# Patient Record
Sex: Male | Born: 1965
Health system: Southern US, Community
[De-identification: ages and names within clinical notes are randomized; demographics above are authoritative.]

## PROBLEM LIST (undated history)

## (undated) DIAGNOSIS — K219 Gastro-esophageal reflux disease without esophagitis: Secondary | ICD-10-CM

## (undated) DIAGNOSIS — R319 Hematuria, unspecified: Secondary | ICD-10-CM

## (undated) DIAGNOSIS — L409 Psoriasis, unspecified: Secondary | ICD-10-CM

## (undated) DIAGNOSIS — E785 Hyperlipidemia, unspecified: Secondary | ICD-10-CM

## (undated) HISTORY — DX: Hematuria, unspecified: R31.9

## (undated) HISTORY — PX: WISDOM TOOTH EXTRACTION: SHX21

## (undated) HISTORY — PX: NO PAST SURGERIES: SHX2092

## (undated) HISTORY — DX: Hyperlipidemia, unspecified: E78.5

## (undated) HISTORY — DX: Psoriasis, unspecified: L40.9

---

## 2014-08-08 DIAGNOSIS — R319 Hematuria, unspecified: Secondary | ICD-10-CM

## 2014-08-08 HISTORY — DX: Hematuria, unspecified: R31.9

## 2015-04-22 ENCOUNTER — Encounter: Payer: Self-pay | Admitting: *Deleted

## 2015-04-24 ENCOUNTER — Ambulatory Visit (INDEPENDENT_AMBULATORY_CARE_PROVIDER_SITE_OTHER): Payer: BLUE CROSS/BLUE SHIELD | Admitting: Obstetrics and Gynecology

## 2015-04-24 ENCOUNTER — Encounter: Payer: Self-pay | Admitting: Obstetrics and Gynecology

## 2015-04-24 VITALS — BP 137/91 | HR 75 | Resp 16 | Ht 74.0 in | Wt 205.3 lb

## 2015-04-24 DIAGNOSIS — R310 Gross hematuria: Secondary | ICD-10-CM

## 2015-04-24 DIAGNOSIS — R972 Elevated prostate specific antigen [PSA]: Secondary | ICD-10-CM

## 2015-04-24 LAB — MICROSCOPIC EXAMINATION
Bacteria, UA: NONE SEEN
EPITHELIAL CELLS (NON RENAL): NONE SEEN /HPF (ref 0–10)
RBC, UA: NONE SEEN /hpf (ref 0–?)

## 2015-04-24 LAB — URINALYSIS, COMPLETE
BILIRUBIN UA: NEGATIVE
Glucose, UA: NEGATIVE
Ketones, UA: NEGATIVE
Leukocytes, UA: NEGATIVE
Nitrite, UA: NEGATIVE
PH UA: 6 (ref 5.0–7.5)
Protein, UA: NEGATIVE
RBC UA: NEGATIVE
Specific Gravity, UA: 1.02 (ref 1.005–1.030)
UUROB: 0.2 mg/dL (ref 0.2–1.0)

## 2015-04-24 NOTE — Progress Notes (Signed)
04/24/2015 12:05 PM   Trevor Walker 1966-04-26 [id number]  Referring provider: No referring provider defined for this encounter.  Chief Complaint  Patient presents with  . Hematuria    HPI: Patient is a 49 year old male presenting today with a history of rising PSA presenting today with complaints of gross hematuria.  1. Gross hematuria- patient reports intermittent bright red gross hematuria over the last 6-7 weeks. He has noticed very mild dysuria but otherwise complains of no other urinary symptoms. No history of renal stones. Patient was never a smoker.  2.History of rising PSA-Per patient provided records he has a history of a rising PSA but it seemed that it was transient and PSA checked last month was 2.0. He denies any urinary symptoms other than above gross hematuria. He believes that his grandfather may have had prostate cancer. 06/15/12 PSA 1.12 04/23/14 PSA 2.70 04/01/15 PSA 2.0    PMH: Past Medical History  Diagnosis Date  . HLD (hyperlipidemia)   . Hematuria     Surgical History: History reviewed. No pertinent past surgical history.  Home Medications:    Medication List       This list is accurate as of: 04/24/15 12:05 PM.  Always use your most recent med list.               omeprazole 20 MG capsule  Commonly known as:  PRILOSEC  Take 20 mg by mouth daily.        Allergies:  Allergies  Allergen Reactions  . Sulfa Antibiotics Rash    Family History: Family History  Problem Relation Age of Onset  . Heart failure Father   . Diabetes Father   . Stroke Paternal Uncle   . Diabetes Paternal Uncle     Social History:  reports that he has never smoked. He does not have any smokeless tobacco history on file. His alcohol and drug histories are not on file.  ROS: UROLOGY Frequent Urination?: No Hard to postpone urination?: No Burning/pain with urination?: Yes Get up at night to urinate?: Yes Leakage of urine?: No Urine stream starts and stops?:  No Trouble starting stream?: No Do you have to strain to urinate?: No Blood in urine?: Yes Urinary tract infection?: No Sexually transmitted disease?: No Injury to kidneys or bladder?: No Painful intercourse?: No Weak stream?: No Erection problems?: No Penile pain?: No  Gastrointestinal Nausea?: No Vomiting?: No Indigestion/heartburn?: Yes Diarrhea?: No Constipation?: No  Constitutional Fever: No Night sweats?: No Weight loss?: No Fatigue?: No  Skin Skin rash/lesions?: No Itching?: No  Eyes Blurred vision?: No Double vision?: No  Ears/Nose/Throat Sore throat?: No Sinus problems?: No  Hematologic/Lymphatic Swollen glands?: No Easy bruising?: No  Cardiovascular Leg swelling?: No Chest pain?: No  Respiratory Cough?: No Shortness of breath?: No  Endocrine Excessive thirst?: No  Musculoskeletal Back pain?: No Joint pain?: No  Neurological Headaches?: No Dizziness?: No  Psychologic Depression?: No Anxiety?: No  Physical Exam: BP 137/91 mmHg  Pulse 75  Resp 16  Ht 6\' 2"  (1.88 m)  Wt 205 lb 4.8 oz (93.123 kg)  BMI 26.35 kg/m2  Constitutional:  Alert and oriented, No acute distress. HEENT: [place] AT, moist mucus membranes.  Trachea midline, no masses. Cardiovascular: No clubbing, cyanosis, or edema. Respiratory: Normal respiratory effort, no increased work of breathing. GI: Abdomen is soft, nontender, nondistended, no abdominal masses GU: No CVA tenderness, circumcised normal phallus, testicles distended bilaterally without palpable masses or tenderness  DRE: Prostate normal in size and smooth Skin: No  rashes, bruises or suspicious lesions. Lymph: No cervical or inguinal adenopathy. Neurologic: Grossly intact, no focal deficits, moving all 4 extremities. Psychiatric: Normal mood and affect.  Laboratory Data:   Urinalysis No results found for: COLORURINE, APPEARANCEUR, LABSPEC, PHURINE, GLUCOSEU, HGBUR, BILIRUBINUR, KETONESUR, PROTEINUR,  UROBILINOGEN, NITRITE, LEUKOCYTESUR  Pertinent Imaging:  Assessment & Plan:   1. Gross hematuria- UA unremarkable today. We discussed the differential diagnosis for microscopic hematuria including nephrolithiasis, renal or upper tract tumors, bladder stones, UTIs, or bladder tumors as well as undetermined etiologies. Per AUA guidelines, I did recommend complete microscopic hematuria evaluation including CTU, possible urine cytology, and office cystoscopy.  Patient is in agreement with plan. - Urinalysis, Complete  2. History of Rising PSA- Per patient provided records he has a history of a rising PSA but it seemed that it was transient and PSA checked last month was 2.0. He denies any urinary symptoms other than above gross hematuria. Prostate exam unremarkable today. He believes that his grandfather may have had prostate cancer. Recommend yearly monitoring here or with patient's PCP with referral for any further elevations of PSA. 06/15/12 PSA 1.12 04/23/14 PSA 2.70 04/01/15 PSA 2.0  Return for schedule cystoscopy; f/u appt CT urogram results.  These notes generated with voice recognition software. I apologize for typographical errors.  Herbert Moors, [facility] [address] [place], [place] [address] [phone]

## 2015-05-12 ENCOUNTER — Ambulatory Visit
Admission: RE | Admit: 2015-05-12 | Discharge: 2015-05-12 | Disposition: A | Discharge status: Home / Self Care | Payer: BLUE CROSS/BLUE SHIELD | Source: Ambulatory Visit | Attending: Obstetrics and Gynecology | Admitting: Obstetrics and Gynecology

## 2015-05-12 DIAGNOSIS — R310 Gross hematuria: Secondary | ICD-10-CM | POA: Insufficient documentation

## 2015-05-12 MED ORDER — IOHEXOL 350 MG/ML SOLN
125.0000 mL | Freq: Once | INTRAVENOUS | Status: AC | PRN
  Administered 2015-05-12: 125 mL via INTRAVENOUS

## 2015-05-20 ENCOUNTER — Other Ambulatory Visit: Payer: BLUE CROSS/BLUE SHIELD

## 2015-05-20 ENCOUNTER — Encounter: Payer: Self-pay | Admitting: Urology

## 2015-05-20 ENCOUNTER — Ambulatory Visit (INDEPENDENT_AMBULATORY_CARE_PROVIDER_SITE_OTHER): Payer: BLUE CROSS/BLUE SHIELD | Admitting: Urology

## 2015-05-20 VITALS — BP 122/84 | HR 71 | Ht 74.0 in | Wt 209.1 lb

## 2015-05-20 DIAGNOSIS — R310 Gross hematuria: Secondary | ICD-10-CM | POA: Diagnosis not present

## 2015-05-20 LAB — URINALYSIS, COMPLETE
BILIRUBIN UA: NEGATIVE
GLUCOSE, UA: NEGATIVE
KETONES UA: NEGATIVE
Leukocytes, UA: NEGATIVE
NITRITE UA: NEGATIVE
Protein, UA: NEGATIVE
RBC UA: NEGATIVE
SPEC GRAV UA: 1.015 (ref 1.005–1.030)
Urobilinogen, Ur: 0.2 mg/dL (ref 0.2–1.0)
pH, UA: 6.5 (ref 5.0–7.5)

## 2015-05-20 LAB — MICROSCOPIC EXAMINATION
BACTERIA UA: NONE SEEN
Epithelial Cells (non renal): NONE SEEN /hpf (ref 0–10)
RBC, UA: NONE SEEN /hpf (ref 0–?)
Renal Epithel, UA: NONE SEEN /hpf
WBC UA: NONE SEEN /HPF (ref 0–?)

## 2015-05-20 MED ORDER — LIDOCAINE HCL 2 % EX GEL
1.0000 "application " | Freq: Once | CUTANEOUS | Status: AC
  Administered 2015-05-20: 1 via URETHRAL

## 2015-05-20 MED ORDER — CIPROFLOXACIN HCL 500 MG PO TABS
500.0000 mg | ORAL_TABLET | Freq: Once | ORAL | Status: AC
  Administered 2015-05-20: 500 mg via ORAL

## 2015-05-20 NOTE — Progress Notes (Signed)
05/20/2015 4:03 PM   Trevor Walker 04-18-1966 [id number]  Referring provider: No referring provider defined for this encounter.  Chief Complaint  Patient presents with  . Hematuria    HPI: HPI: Patient is a 49 year old male presenting today with a history of rising PSA presenting today with complaints of gross hematuria.  1. Gross hematuria- patient reports intermittent bright red gross hematuria over the last 6-7 weeks. He has noticed very mild dysuria but otherwise complains of no other urinary symptoms. No history of renal stones. Patient was never a smoker.  2.History of rising PSA-Per patient provided records he has a history of a rising PSA but it seemed that it was transient and PSA checked last month was 2.0. He denies any urinary symptoms other than above gross hematuria. He believes that his grandfather may have had prostate cancer. 06/15/12 PSA 1.12 04/23/14 PSA 2.70 04/01/15 PSA 2.0     PMH: Past Medical History  Diagnosis Date  . HLD (hyperlipidemia)   . Hematuria     Surgical History: Past Surgical History  Procedure Laterality Date  . No past surgeries      Home Medications:    Medication List       This list is accurate as of: 05/20/15  4:03 PM.  Always use your most recent med list.               ketoconazole 2 % cream  Commonly known as:  NIZORAL  Apply 1 application topically daily.     omeprazole 20 MG capsule  Commonly known as:  PRILOSEC  Take 20 mg by mouth daily.        Allergies:  Allergies  Allergen Reactions  . Sulfa Antibiotics Rash    Family History: Family History  Problem Relation Age of Onset  . Heart failure Father   . Diabetes Father   . Stroke Paternal Uncle   . Diabetes Paternal Uncle     Social History:  reports that he has never smoked. He does not have any smokeless tobacco history on file. He reports that he drinks alcohol. He reports that he does not use illicit drugs.  ROS:                                         Physical Exam: BP 122/84 mmHg  Pulse 71  Ht 6\' 2"  (1.88 m)  Wt 94.847 kg (209 lb 1.6 oz)  BMI 26.84 kg/m2  Constitutional:  Alert and oriented, No acute distress. HEENT: [place] AT, moist mucus membranes.  Trachea midline, no masses. Cardiovascular: No clubbing, cyanosis, or edema. Respiratory: Normal respiratory effort, no increased work of breathing. GI: Abdomen is soft, nontender, nondistended, no abdominal masses GU: No CVA tenderness. Male genitalia normal Skin: No rashes, bruises or suspicious lesions. Lymph: No cervical or inguinal adenopathy. Neurologic: Grossly intact, no focal deficits, moving all 4 extremities. Psychiatric: Normal mood and affect.  Laboratory Data:  Urinalysis    Component Value Date/Time   GLUCOSEU Negative 04/24/2015 1104   BILIRUBINUR Negative 04/24/2015 1104   NITRITE Negative 04/24/2015 1104   LEUKOCYTESUR Negative 04/24/2015 1104    Pertinent Imaging CT scan normal  Cystoscopy: After verbal consent patient underwent forceful cystoscopy utilizing sterile technique. The penile bulbar membranous and prostatic urethra were normal. He a little bit of a high riding bladder neck. There is no trabeculation or cystitis. There is no foreign  body or carcinoma. There is no erythema. He tolerated the procedure very well   Assessment & Plan:  The patient is here today to complete his evaluation for hematuria. He is here for cystoscopy. He's had no hematuria since his last episode. He's had a negative workup will be seen when necessary  1. Gross hematuria See prn   Return if symptoms worsen or fail to improve.  Reece Packer, MD  [facility] [address], [place], [place] [address] [phone]

## 2016-08-30 DIAGNOSIS — Z1389 Encounter for screening for other disorder: Secondary | ICD-10-CM | POA: Diagnosis not present

## 2016-08-30 DIAGNOSIS — Z0000 Encounter for general adult medical examination without abnormal findings: Secondary | ICD-10-CM | POA: Diagnosis not present

## 2016-08-30 DIAGNOSIS — Z23 Encounter for immunization: Secondary | ICD-10-CM | POA: Diagnosis not present

## 2016-09-29 ENCOUNTER — Other Ambulatory Visit: Payer: Self-pay | Admitting: Gastroenterology

## 2016-11-28 ENCOUNTER — Encounter (HOSPITAL_COMMUNITY): Payer: Self-pay | Admitting: *Deleted

## 2016-11-29 ENCOUNTER — Ambulatory Visit (HOSPITAL_COMMUNITY): Payer: BLUE CROSS/BLUE SHIELD | Admitting: Anesthesiology

## 2016-11-29 ENCOUNTER — Ambulatory Visit (HOSPITAL_COMMUNITY)
Admission: RE | Admit: 2016-11-29 | Discharge: 2016-11-29 | Disposition: A | Discharge status: Home / Self Care | Payer: BLUE CROSS/BLUE SHIELD | Source: Ambulatory Visit | Attending: Gastroenterology | Admitting: Gastroenterology

## 2016-11-29 ENCOUNTER — Encounter (HOSPITAL_COMMUNITY)
Admission: RE | Disposition: A | Discharge status: Home / Self Care | Payer: Self-pay | Source: Ambulatory Visit | Attending: Gastroenterology

## 2016-11-29 ENCOUNTER — Encounter (HOSPITAL_COMMUNITY): Payer: Self-pay | Admitting: *Deleted

## 2016-11-29 DIAGNOSIS — Z1211 Encounter for screening for malignant neoplasm of colon: Secondary | ICD-10-CM | POA: Insufficient documentation

## 2016-11-29 DIAGNOSIS — K621 Rectal polyp: Secondary | ICD-10-CM | POA: Insufficient documentation

## 2016-11-29 DIAGNOSIS — Z882 Allergy status to sulfonamides status: Secondary | ICD-10-CM | POA: Diagnosis not present

## 2016-11-29 DIAGNOSIS — K219 Gastro-esophageal reflux disease without esophagitis: Secondary | ICD-10-CM | POA: Diagnosis not present

## 2016-11-29 DIAGNOSIS — Z886 Allergy status to analgesic agent status: Secondary | ICD-10-CM | POA: Insufficient documentation

## 2016-11-29 DIAGNOSIS — D120 Benign neoplasm of cecum: Secondary | ICD-10-CM | POA: Diagnosis not present

## 2016-11-29 DIAGNOSIS — D128 Benign neoplasm of rectum: Secondary | ICD-10-CM | POA: Diagnosis not present

## 2016-11-29 HISTORY — PX: COLONOSCOPY WITH PROPOFOL: SHX5780

## 2016-11-29 HISTORY — DX: Gastro-esophageal reflux disease without esophagitis: K21.9

## 2016-11-29 SURGERY — COLONOSCOPY WITH PROPOFOL
Anesthesia: Monitor Anesthesia Care

## 2016-11-29 MED ORDER — LIDOCAINE 2% (20 MG/ML) 5 ML SYRINGE
INTRAMUSCULAR | Status: DC | PRN
  Administered 2016-11-29: 60 mg via INTRAVENOUS

## 2016-11-29 MED ORDER — PROPOFOL 10 MG/ML IV BOLUS
INTRAVENOUS | Status: DC | PRN
  Administered 2016-11-29: 40 mg via INTRAVENOUS
  Administered 2016-11-29: 20 mg via INTRAVENOUS

## 2016-11-29 MED ORDER — PROPOFOL 500 MG/50ML IV EMUL
INTRAVENOUS | Status: DC | PRN
  Administered 2016-11-29: 150 ug/kg/min via INTRAVENOUS

## 2016-11-29 MED ORDER — LIDOCAINE 2% (20 MG/ML) 5 ML SYRINGE
INTRAMUSCULAR | Status: AC
  Filled 2016-11-29: qty 5

## 2016-11-29 MED ORDER — SODIUM CHLORIDE 0.9 % IV SOLN: INTRAVENOUS | Status: DC

## 2016-11-29 MED ORDER — PROPOFOL 10 MG/ML IV BOLUS
INTRAVENOUS | Status: AC
  Filled 2016-11-29: qty 60

## 2016-11-29 MED ORDER — LACTATED RINGERS IV SOLN
INTRAVENOUS | Status: DC
  Administered 2016-11-29: 1000 mL via INTRAVENOUS

## 2016-11-29 SURGICAL SUPPLY — 21 items

## 2016-11-29 NOTE — Anesthesia Procedure Notes (Signed)
Procedure Name: MAC Date/Time: 11/29/2016 7:51 AM Performed by: Dione Booze Pre-anesthesia Checklist: Patient identified, Emergency Drugs available, Suction available and Patient being monitored Patient Re-evaluated:Patient Re-evaluated prior to inductionOxygen Delivery Method: Simple face mask Placement Confirmation: positive ETCO2

## 2016-11-29 NOTE — Discharge Instructions (Signed)

## 2016-11-29 NOTE — Anesthesia Preprocedure Evaluation (Signed)
Anesthesia Evaluation  Patient identified by MRN, date of birth, ID band Patient awake    Reviewed: Allergy & Precautions, NPO status , Patient's Chart, lab work & pertinent test results  Airway Mallampati: II  TM Distance: >3 FB Neck ROM: Full    Dental no notable dental hx.    Pulmonary neg pulmonary ROS,    Pulmonary exam normal breath sounds clear to auscultation       Cardiovascular negative cardio ROS Normal cardiovascular exam Rhythm:Regular Rate:Normal     Neuro/Psych negative neurological ROS  negative psych ROS   GI/Hepatic Neg liver ROS, GERD  Medicated,  Endo/Other  negative endocrine ROS  Renal/GU negative Renal ROS  negative genitourinary   Musculoskeletal negative musculoskeletal ROS (+)   Abdominal   Peds negative pediatric ROS (+)  Hematology negative hematology ROS (+)   Anesthesia Other Findings   Reproductive/Obstetrics negative OB ROS                             Anesthesia Physical Anesthesia Plan  ASA: II  Anesthesia Plan: MAC   Post-op Pain Management:    Induction: Intravenous  Airway Management Planned: Nasal Cannula  Additional Equipment:   Intra-op Plan:   Post-operative Plan:   Informed Consent: I have reviewed the patients History and Physical, chart, labs and discussed the procedure including the risks, benefits and alternatives for the proposed anesthesia with the patient or authorized representative who has indicated his/her understanding and acceptance.   Dental advisory given  Plan Discussed with: CRNA and Surgeon  Anesthesia Plan Comments:         Anesthesia Quick Evaluation

## 2016-11-29 NOTE — Op Note (Signed)
[facility] Patient Name: Trevor Walker Procedure Date: 11/29/2016 MRN: [id number] Attending MD: Garlan Fair , MD Date of Birth: 1966-02-19 CSN: [id number] Age: 51 Admit Type: Outpatient Procedure:                Colonoscopy Indications:              Screening for colorectal malignant neoplasm Providers:                Garlan Fair, MD, Laverta Baltimore RN, RN,                            Cherylynn Ridges, Technician Referring MD:              Medicines:                Propofol per Anesthesia Complications:            No immediate complications. Estimated Blood Loss:     Estimated blood loss was minimal. Procedure:                Pre-Anesthesia Assessment:                           - Prior to the procedure, a History and Physical                            was performed, and patient medications and                            allergies were reviewed. The patient's tolerance of                            previous anesthesia was also reviewed. The risks                            and benefits of the procedure and the sedation                            options and risks were discussed with the patient.                            All questions were answered, and informed consent                            was obtained. Prior Anticoagulants: The patient has                            taken no previous anticoagulant or antiplatelet                            agents. ASA Grade Assessment: II - A patient with                            mild systemic disease. After reviewing the risks  and benefits, the patient was deemed in                            satisfactory condition to undergo the procedure.                           After obtaining informed consent, the colonoscope                            was passed under direct vision. Throughout the                            procedure, the patient's blood pressure, pulse, and    oxygen saturations were monitored continuously. The                            EC-[id number] ([id number]) scope was introduced through                            the anus and advanced to the the cecum, identified                            by appendiceal orifice and ileocecal valve. The                            colonoscopy was performed without difficulty. The                            patient tolerated the procedure well. The quality                            of the bowel preparation was good. The appendiceal                            orifice and the rectum were photographed. Scope In: 0:62:37 AM Scope Out: 8:27:31 AM Total Procedure Duration: 0 hours 30 minutes 47 seconds  Findings:      The perianal and digital rectal examinations were normal.      Two sessile polyps were found in the cecum. The polyps were 3 to 4 mm in       size. These polyps were removed with a cold biopsy forceps. Resection       and retrieval were complete.      A 5 mm polyp was found in the rectum. The polyp was sessile. The polyp       was removed with a cold snare. Resection and retrieval were complete.      The exam was otherwise without abnormality. Impression:               - Two 3 to 4 mm polyps in the cecum, removed with a                            cold biopsy forceps. Resected and retrieved.                           -  One 5 mm polyp in the rectum, removed with a cold                            snare. Resected and retrieved.                           - The examination was otherwise normal. Moderate Sedation:      N/A- Per Anesthesia Care Recommendation:           - Patient has a contact number available for                            emergencies. The signs and symptoms of potential                            delayed complications were discussed with the                            patient. Return to normal activities tomorrow.                            Written discharge instructions were provided to the                             patient.                           - Repeat colonoscopy date to be determined after                            pending pathology results are reviewed for                            surveillance.                           - Resume previous diet.                           - Continue present medications. Procedure Code(s):        --- Professional ---                           [phone], Colonoscopy, flexible; with removal of                            tumor(s), polyp(s), or other lesion(s) by snare                            technique                           45380, 59, Colonoscopy, flexible; with biopsy,                            single or multiple Diagnosis Code(s):        ---  Professional ---                           Z12.11, Encounter for screening for malignant                            neoplasm of colon                           D12.0, Benign neoplasm of cecum                           K62.1, Rectal polyp CPT copyright 2016 American Medical Association. All rights reserved. The codes documented in this report are preliminary and upon coder review may  be revised to meet current compliance requirements. Earle Gell, MD Garlan Fair, MD 11/29/2016 8:34:12 AM This report has been signed electronically. Number of Addenda: 0

## 2016-11-29 NOTE — H&P (Signed)
Procedure: Baseline screening colonoscopy  History: The patient is a 51 year old male born [birth year]. He is scheduled to undergo his first screening colonoscopy with polypectomy to prevent colon cancer.  Medication allergies: Sulfa drugs cause rash. Ibuprofen causes rash.  Past medical history: Gastroesophageal reflux  Family history: Negative for colon cancer  Exam: The patient is alert and lying comfortably on the endoscopy stretcher. Abdomen is soft and nontender to palpation. Lungs are clear to auscultation. Cardiac exam reveals a regular rhythm.  Plan: Proceed with baseline screening colonoscopy

## 2016-11-29 NOTE — Transfer of Care (Signed)
Immediate Anesthesia Transfer of Care Note  Patient: Trevor Walker  Procedure(s) Performed: Procedure(s):  colonscopy  w propofol (N/A)  Patient Location: PACU and Endoscopy Unit  Anesthesia Type:MAC  Level of Consciousness: sedated and patient cooperative  Airway & Oxygen Therapy: Patient Spontanous Breathing and Patient connected to face mask oxygen  Post-op Assessment: Report given to RN and Post -op Vital signs reviewed and stable  Post vital signs: Reviewed and stable  Last Vitals:  Vitals:   11/29/16 0712  BP: 128/81  Pulse: 68  Resp: 13  Temp: 36.7 C    Last Pain:  Vitals:   11/29/16 0712  TempSrc: Oral         Complications: No apparent anesthesia complications

## 2016-11-29 NOTE — Anesthesia Postprocedure Evaluation (Signed)
Anesthesia Post Note  Patient: Trevor Walker  Procedure(s) Performed: Procedure(s) (LRB):  colonscopy  w propofol (N/A)  Patient location during evaluation: PACU Anesthesia Type: MAC Level of consciousness: awake and alert Pain management: pain level controlled Vital Signs Assessment: post-procedure vital signs reviewed and stable Respiratory status: spontaneous breathing, nonlabored ventilation, respiratory function stable and patient connected to nasal cannula oxygen Cardiovascular status: stable and blood pressure returned to baseline Anesthetic complications: no       Last Vitals:  Vitals:   11/29/16 0850 11/29/16 0855  BP: 112/61 125/82  Pulse: (!) 59 69  Resp: 13 15  Temp:      Last Pain:  Vitals:   11/29/16 0833  TempSrc: Oral                 Aleicia Kenagy S

## 2016-12-01 ENCOUNTER — Encounter (HOSPITAL_COMMUNITY): Payer: Self-pay | Admitting: Gastroenterology

## 2017-01-09 NOTE — Addendum Note (Signed)
Addendum  created 01/09/17 1345 by Myrtie Soman, MD   Sign clinical note

## 2017-01-09 NOTE — Anesthesia Postprocedure Evaluation (Signed)
Anesthesia Post Note  Patient: Trevor Walker  Procedure(s) Performed: Procedure(s) (LRB):  colonscopy  w propofol (N/A)     Anesthesia Post Evaluation  Last Vitals:  Vitals:   11/29/16 0850 11/29/16 0855  BP: 112/61 125/82  Pulse: (!) 59 69  Resp: 13 15  Temp:      Last Pain:  Vitals:   11/30/16 1331  TempSrc:   PainSc: 0-No pain                 Gerlene Glassburn S

## 2017-09-27 DIAGNOSIS — Z125 Encounter for screening for malignant neoplasm of prostate: Secondary | ICD-10-CM | POA: Diagnosis not present

## 2017-09-27 DIAGNOSIS — Z0000 Encounter for general adult medical examination without abnormal findings: Secondary | ICD-10-CM | POA: Diagnosis not present

## 2017-09-27 DIAGNOSIS — Z136 Encounter for screening for cardiovascular disorders: Secondary | ICD-10-CM | POA: Diagnosis not present

## 2017-09-27 DIAGNOSIS — Z1389 Encounter for screening for other disorder: Secondary | ICD-10-CM | POA: Diagnosis not present

## 2018-10-03 DIAGNOSIS — Z0000 Encounter for general adult medical examination without abnormal findings: Secondary | ICD-10-CM | POA: Diagnosis not present

## 2018-10-03 DIAGNOSIS — Z1389 Encounter for screening for other disorder: Secondary | ICD-10-CM | POA: Diagnosis not present

## 2018-10-03 DIAGNOSIS — Z13220 Encounter for screening for lipoid disorders: Secondary | ICD-10-CM | POA: Diagnosis not present

## 2018-10-03 DIAGNOSIS — K219 Gastro-esophageal reflux disease without esophagitis: Secondary | ICD-10-CM | POA: Diagnosis not present

## 2018-10-03 DIAGNOSIS — Z125 Encounter for screening for malignant neoplasm of prostate: Secondary | ICD-10-CM | POA: Diagnosis not present

## 2018-12-28 ENCOUNTER — Ambulatory Visit: Payer: BLUE CROSS/BLUE SHIELD | Admitting: Podiatry

## 2019-01-04 ENCOUNTER — Ambulatory Visit (INDEPENDENT_AMBULATORY_CARE_PROVIDER_SITE_OTHER): Payer: BLUE CROSS/BLUE SHIELD

## 2019-01-04 ENCOUNTER — Ambulatory Visit: Payer: BLUE CROSS/BLUE SHIELD | Admitting: Podiatry

## 2019-01-04 ENCOUNTER — Other Ambulatory Visit: Payer: Self-pay

## 2019-01-04 ENCOUNTER — Other Ambulatory Visit: Payer: Self-pay | Admitting: Podiatry

## 2019-01-04 VITALS — Temp 97.5°F

## 2019-01-04 DIAGNOSIS — L309 Dermatitis, unspecified: Secondary | ICD-10-CM

## 2019-01-04 DIAGNOSIS — L409 Psoriasis, unspecified: Secondary | ICD-10-CM | POA: Diagnosis not present

## 2019-01-04 DIAGNOSIS — M109 Gout, unspecified: Secondary | ICD-10-CM | POA: Diagnosis not present

## 2019-01-04 DIAGNOSIS — M25571 Pain in right ankle and joints of right foot: Secondary | ICD-10-CM | POA: Diagnosis not present

## 2019-01-04 DIAGNOSIS — M7751 Other enthesopathy of right foot: Secondary | ICD-10-CM | POA: Diagnosis not present

## 2019-01-04 DIAGNOSIS — M79671 Pain in right foot: Secondary | ICD-10-CM

## 2019-01-04 DIAGNOSIS — R600 Localized edema: Secondary | ICD-10-CM

## 2019-01-05 LAB — SYNOVIAL FLUID, CRYSTAL

## 2019-01-06 NOTE — Progress Notes (Signed)
Subjective:  Patient ID: Trevor Walker, male    DOB: 16-Jul-1966,  MRN: [id number]  Chief Complaint  Patient presents with  . Foot Problem    Pt states right 1st MPJ Redness/pain/swelling 3-4 week duration, worse at night, pain comes and goes. Pt states history of possible gout.    53 y.o. male presents with the above complaint.  States that the pain started when he went on a trip to [place] unsure if he was eating differently with time has had this pain on and off since then.  Endorses possible history of gout but thinks it was 67.  Separately complains of a skin condition that seems to be over his entire body.  Recently diagnosed with psoriasis by his dermatologist.  Has been trying several different medications to get this under control.   Review of Systems: Negative except as noted in the HPI. Denies N/V/F/Ch.  Past Medical History:  Diagnosis Date  . GERD (gastroesophageal reflux disease)   . Hematuria 2016  . HLD (hyperlipidemia)     Current Outpatient Medications:  .  Apremilast (OTEZLA) 30 MG TABS, Take by mouth., Disp: , Rfl:  .  Cholecalciferol (VITAMIN D PO), Take 1 capsule by mouth daily., Disp: , Rfl:  .  indomethacin (INDOCIN) 25 MG capsule, Take 25 mg by mouth 2 (two) times daily with a meal., Disp: , Rfl:  .  ketoconazole (NIZORAL) 2 % cream, Apply 1 application topically daily as needed (dry skin). , Disp: , Rfl:  .  omeprazole (PRILOSEC) 20 MG capsule, Take 20 mg by mouth daily., Disp: , Rfl:   Social History   Tobacco Use  Smoking Status Never Smoker  Smokeless Tobacco Never Used    Allergies  Allergen Reactions  . Sulfa Antibiotics Rash   Objective:   Vitals:   01/04/19 1206  Temp: (!) 97.5 F (36.4 C)   There is no height or weight on file to calculate BMI. Constitutional Well developed. Well nourished.  Vascular Dorsalis pedis pulses palpable bilaterally. Posterior tibial pulses palpable bilaterally. Capillary refill normal to all  digits.  No cyanosis or clubbing noted. Pedal hair growth normal.  Neurologic Normal speech. Oriented to person, place, and time. Epicritic sensation to light touch grossly present bilaterally.  Dermatologic Nails well groomed and normal in appearance. No open wounds.  Patchy nonerythematous pruritic lesions about the bilateral lower extremities and extensor surfaces of the forearms, flexor surface of the thigh  Orthopedic:  Warmth erythema edema and pain about the right first MPJ with pain on range of motion   Radiographs: Taken and reviewed no acute fracture dislocations edema noted about the right first MPJ no underlying erosions of the left first metatarsal or proximal phalanx Assessment:   1. Capsulitis of metatarsophalangeal (MTP) joint of right foot   2. Acute gout involving toe of right foot, unspecified cause   3. Psoriasis   4. Dermatitis   5. Localized edema   6. Arthralgia of right foot    Plan:  Patient was evaluated and treated and all questions answered.  Gout R 1st MPJ -XR taken and reviewed no evidence of medial marginal erosions concerning for gouty arthritis -Reviewed prior UA labs normal however discussed that a normal UA does not preclude gout -Discussed treatments including oral medications and joint aspiration injection -Proceed with joint aspiration injection today.  Following sterile skin prep, the toe was anesthetized with 5 cc of lidocaine 1% plain.  The joint was then aspirated 18-gauge needle with straw-colored fluid  aspirated.  0.5 cc of Marcaine and 0.5 cc of dexamethasone was injected into the joint.  ?  Psoriasis -Full body skin condition does appear psoriatic like a distribution however the patches do not have typical psoriatic appearance -We will do arthritic work-up for both gout process and for possible underlying autoimmune condition  50 minutes of face to face time were spent with the patient. >50% of this was spent on counseling and  coordination of care. Specifically discussed with patient the above diagnoses and overall treatment plan.   No follow-ups on file.

## 2019-01-08 ENCOUNTER — Telehealth: Payer: Self-pay | Admitting: Podiatry

## 2019-01-08 LAB — ANA: Anti Nuclear Antibody (ANA): NEGATIVE

## 2019-01-08 LAB — RHEUMATOID FACTOR: Rhuematoid fact SerPl-aCnc: <14 IU/mL (ref <14)

## 2019-01-08 LAB — HLA-B27 ANTIGEN: HLA-B27 Antigen: NEGATIVE

## 2019-01-08 NOTE — Telephone Encounter (Signed)
Patient is returning phone call from Dr. March Rummage

## 2019-01-09 NOTE — Telephone Encounter (Signed)
I called and spoke to him... didn't have anythign additional to call him about

## 2019-01-11 ENCOUNTER — Telehealth: Payer: Self-pay | Admitting: Podiatry

## 2019-01-11 NOTE — Telephone Encounter (Signed)
Pt called stating that he was seen last week and his foot doesn't seem to be getting better. Patient would like a call from the nurse to discuss some options and see if we have received an results from tests he had done.

## 2019-01-18 ENCOUNTER — Other Ambulatory Visit: Payer: Self-pay

## 2019-01-18 ENCOUNTER — Ambulatory Visit (INDEPENDENT_AMBULATORY_CARE_PROVIDER_SITE_OTHER): Payer: BC Managed Care – PPO | Admitting: Podiatry

## 2019-01-18 DIAGNOSIS — R600 Localized edema: Secondary | ICD-10-CM | POA: Diagnosis not present

## 2019-01-18 DIAGNOSIS — M25571 Pain in right ankle and joints of right foot: Secondary | ICD-10-CM | POA: Diagnosis not present

## 2019-01-18 DIAGNOSIS — M109 Gout, unspecified: Secondary | ICD-10-CM

## 2019-01-18 DIAGNOSIS — M7751 Other enthesopathy of right foot: Secondary | ICD-10-CM | POA: Diagnosis not present

## 2019-01-20 NOTE — Progress Notes (Signed)
Subjective:  Patient ID: Trevor Walker, male    DOB: 1966-04-26,  MRN: [id number]  Chief Complaint  Patient presents with  . Toe Pain    2 week follow up right hallux pain and swelling - still swollen    53 y.o. male presents with the above complaint. Still having pain in the joint states that the injection only helped for a couple days.  The joint is still red and swollen and he would like to discuss further treatments.  Review of Systems: Negative except as noted in the HPI. Denies N/V/F/Ch.  Past Medical History:  Diagnosis Date  . GERD (gastroesophageal reflux disease)   . Hematuria 2016  . HLD (hyperlipidemia)     Current Outpatient Medications:  .  Apremilast (OTEZLA) 30 MG TABS, Take by mouth., Disp: , Rfl:  .  Cholecalciferol (VITAMIN D PO), Take 1 capsule by mouth daily., Disp: , Rfl:  .  indomethacin (INDOCIN) 25 MG capsule, Take 25 mg by mouth 2 (two) times daily with a meal., Disp: , Rfl:  .  ketoconazole (NIZORAL) 2 % cream, Apply 1 application topically daily as needed (dry skin). , Disp: , Rfl:  .  omeprazole (PRILOSEC) 20 MG capsule, Take 20 mg by mouth daily., Disp: , Rfl:   Social History   Tobacco Use  Smoking Status Never Smoker  Smokeless Tobacco Never Used    Allergies  Allergen Reactions  . Sulfa Antibiotics Rash   Objective:   There were no vitals filed for this visit. There is no height or weight on file to calculate BMI. Constitutional Well developed. Well nourished.  Vascular Dorsalis pedis pulses palpable bilaterally. Posterior tibial pulses palpable bilaterally. Capillary refill normal to all digits.  No cyanosis or clubbing noted. Pedal hair growth normal.  Neurologic Normal speech. Oriented to person, place, and time. Epicritic sensation to light touch grossly present bilaterally.  Dermatologic Nails well groomed and normal in appearance. No open wounds.  Patchy nonerythematous pruritic lesions about the bilateral lower extremities  and extensor surfaces of the forearms, flexor surface of the thigh  Orthopedic:  Warmth erythema edema and pain about the right first MPJ with pain on range of motion   Radiographs: None Assessment:   1. Capsulitis of metatarsophalangeal (MTP) joint of right foot   2. Acute gout involving toe of right foot, unspecified cause   3. Arthralgia of right foot   4. Localized edema    Plan:  Patient was evaluated and treated and all questions answered.  Gout R 1st MPJ -Did have some relief with the injection however this would not long-term and the joint is still red and swollen.  Aspirate was negative for crystal formation.  UA was negative.  Other inflammatory markers negative.  At this point we will proceed with an MRI for further evaluation.  Pending results of MRI we will further discuss treatments.  Would consider steroid pack.  Other differentials could be pseudogout however this would show crystal formation as well on aspirate.  Labs taken shortly prior to evaluation were negative for infectious markers and thus low suspicion for septic joint however this is a differential.  MRI should help delineate whether there are erosive changes in the join suggestive of gout or other severe effusion.  We will follow-up after MRI.  20 minutes of face to face time were spent with the patient. >50% of this was spent on counseling and coordination of care. Specifically discussed with patient the above diagnoses and overall treatment plan.  No follow-ups on file.

## 2019-01-21 ENCOUNTER — Telehealth: Payer: Self-pay | Admitting: *Deleted

## 2019-01-21 DIAGNOSIS — M7751 Other enthesopathy of right foot: Secondary | ICD-10-CM

## 2019-01-21 DIAGNOSIS — M25571 Pain in right ankle and joints of right foot: Secondary | ICD-10-CM

## 2019-01-21 DIAGNOSIS — R600 Localized edema: Secondary | ICD-10-CM

## 2019-01-21 DIAGNOSIS — M109 Gout, unspecified: Secondary | ICD-10-CM

## 2019-01-21 NOTE — Telephone Encounter (Signed)
-----   Message from Evelina Bucy, DPM sent at 01/20/2019  9:32 PM EDT ----- Can we order MRI R Foot - Eval for Gout - Rule out Septic Joint

## 2019-01-21 NOTE — Telephone Encounter (Signed)
Orders to Gretta Arab, RN for pre-cert and faxed to [facility].

## 2019-02-11 ENCOUNTER — Ambulatory Visit
Admission: RE | Admit: 2019-02-11 | Discharge: 2019-02-11 | Disposition: A | Discharge status: Home / Self Care | Payer: BC Managed Care – PPO | Source: Ambulatory Visit | Attending: Podiatry | Admitting: Podiatry

## 2019-02-11 DIAGNOSIS — M7751 Other enthesopathy of right foot: Secondary | ICD-10-CM

## 2019-02-11 DIAGNOSIS — R600 Localized edema: Secondary | ICD-10-CM | POA: Diagnosis not present

## 2019-02-11 DIAGNOSIS — M25475 Effusion, left foot: Secondary | ICD-10-CM | POA: Diagnosis not present

## 2019-02-11 DIAGNOSIS — M25571 Pain in right ankle and joints of right foot: Secondary | ICD-10-CM

## 2019-02-11 DIAGNOSIS — M109 Gout, unspecified: Secondary | ICD-10-CM

## 2019-02-15 ENCOUNTER — Telehealth: Payer: Self-pay

## 2019-02-15 ENCOUNTER — Other Ambulatory Visit: Payer: Self-pay | Admitting: Podiatry

## 2019-02-15 MED ORDER — ALLOPURINOL 100 MG PO TABS: 100.0000 mg | ORAL_TABLET | Freq: Every day | ORAL | 2 refills | Status: DC

## 2019-02-15 NOTE — Telephone Encounter (Signed)
Pt called in regards to an MRI he had done on Monday. Pt states he has not heard anything about it and would like to follow up on it.   Dr. March Rummage was informed of the call. Dr. March Rummage stated he would call the pt to discuss the MRI.

## 2019-02-15 NOTE — Progress Notes (Unsigned)
Called patient to review MRI. Sent rx for Allopurinol. Will have patient f/u in 1 month for f/u appt

## 2019-03-06 ENCOUNTER — Telehealth: Payer: Self-pay | Admitting: Podiatry

## 2019-03-06 NOTE — Telephone Encounter (Signed)
Noted thanks °

## 2019-03-06 NOTE — Telephone Encounter (Signed)
FYI: Called patient about his follow up with Dr. March Rummage. Patient has stated that he has not been taking the medication that was prescribed 2 weeks ago and that he did not need a follow up appointment. He thinks he was diagnosed wrong.

## 2019-03-15 ENCOUNTER — Ambulatory Visit: Payer: BC Managed Care – PPO | Admitting: Podiatry

## 2019-03-15 ENCOUNTER — Other Ambulatory Visit: Payer: Self-pay

## 2019-03-15 VITALS — Temp 97.3°F

## 2019-03-15 DIAGNOSIS — M109 Gout, unspecified: Secondary | ICD-10-CM

## 2019-03-15 NOTE — Progress Notes (Signed)
Subjective:  Patient ID: Trevor Walker, male    DOB: November 26, 1965,  MRN: [id number]  Chief Complaint  Patient presents with  . Foot Pain    Right foot 1st digit pain. Pt states had flare 1 week ago and has since gone down. Pt states he believes allopurinol may have caused "psoriasis outbreak" but states he resumed taking the medication without any issues.     53 y.o. male presents with the above complaint. Had a flare up the night after having a steak and wine. Had restarted allopurinol only a couple days prior.  Review of Systems: Negative except as noted in the HPI. Denies N/V/F/Ch.  Past Medical History:  Diagnosis Date  . GERD (gastroesophageal reflux disease)   . Hematuria 2016  . HLD (hyperlipidemia)     Current Outpatient Medications:  .  allopurinol (ZYLOPRIM) 100 MG tablet, Take 1 tablet (100 mg total) by mouth daily., Disp: 30 tablet, Rfl: 2 .  Apremilast (OTEZLA) 30 MG TABS, Take by mouth., Disp: , Rfl:  .  Cholecalciferol (VITAMIN D PO), Take 1 capsule by mouth daily., Disp: , Rfl:  .  ENSTILAR 0.005-0.064 % FOAM, APPLY TOPICALLY TO THE AFFECTED AREA OF RASH TWICE DAILY FOR TWO WEEKS THEN FIVE DAYS A WEEK UNTIL RASH IMPROVED, Disp: , Rfl:  .  EUCRISA 2 % OINT, APPLY TO AFFECTED AREA TWICE A DAY, Disp: , Rfl:  .  indomethacin (INDOCIN) 25 MG capsule, Take 25 mg by mouth 2 (two) times daily with a meal., Disp: , Rfl:  .  ketoconazole (NIZORAL) 2 % cream, Apply 1 application topically daily as needed (dry skin). , Disp: , Rfl:  .  mometasone (ELOCON) 0.1 % cream, APPLY TO RASH TWICE A DAY FOR 2 WEEKS THEN 5 DAYS A WEEK UNTIL RASH IMPROVED AVOID FACE,GROIN,AXILLA, Disp: , Rfl:  .  mupirocin ointment (BACTROBAN) 2 %, APPLY TO AFFECTED AREA ON THE SKIN DAILY TO ANY OPEN AREAS, Disp: , Rfl:  .  omeprazole (PRILOSEC) 20 MG capsule, Take 20 mg by mouth daily., Disp: , Rfl:   Social History   Tobacco Use  Smoking Status Never Smoker  Smokeless Tobacco Never Used    Allergies   Allergen Reactions  . Ibuprofen     "Causes psoriasis flare"  . Sulfa Antibiotics Rash   Objective:   Vitals:   03/15/19 0851  Temp: (!) 97.3 F (36.3 C)   There is no height or weight on file to calculate BMI. Constitutional Well developed. Well nourished.  Vascular Dorsalis pedis pulses palpable bilaterally. Posterior tibial pulses palpable bilaterally. Capillary refill normal to all digits.  No cyanosis or clubbing noted. Pedal hair growth normal.  Neurologic Normal speech. Oriented to person, place, and time. Epicritic sensation to light touch grossly present bilaterally.  Dermatologic Nails well groomed and normal in appearance. No open wounds.  Patchy nonerythematous pruritic lesions about the bilateral lower extremities and extensor surfaces of the forearms, flexor surface of the thigh  Orthopedic:  Warmth erythema edema and pain about the right first MPJ with pain on range of motion   Radiographs: None Assessment:   1. Acute gout involving toe of right foot, unspecified cause    Plan:  Patient was evaluated and treated and all questions answered.  Gout R 1st MPJ -Following local block with  6cc 50/50 lidocaine 1% plain, marcaine 0.5% plain, the joint was aspirated. Fluid was collected and sent for synovial fluid analysis -Therapeutic injection delivered as below -Still working diagnosis is gout.  Await synovial fluid analysis -Continue allopurinol. Discussed this may have promoted a gout flare up when he first started it.  Procedure: Joint Injection Location: Right 1st MP joint Skin Prep: Alcohol. Injectate: 0.5 cc 1% lidocaine plain, 0.5 cc dexamethasone phosphate. Disposition: Patient tolerated procedure well. Injection site dressed with a band-aid.    Return in about 3 weeks (around 04/05/2019) for Gout f/u right.

## 2019-03-21 ENCOUNTER — Telehealth: Payer: Self-pay | Admitting: Podiatry

## 2019-03-21 NOTE — Telephone Encounter (Signed)
Attempted to call patient to discuss results no answer, VM left.

## 2019-03-22 ENCOUNTER — Telehealth: Payer: Self-pay | Admitting: *Deleted

## 2019-03-22 NOTE — Telephone Encounter (Signed)
I informed pt of Dr. Eleanora Neighbor review of results and orders. Pt states understanding and his wife had wanted him to also discuss with Dr. Lysle Rubens. I told pt that would be fine to do both and the labs would be available for Dr. Lysle Rubens in the Epic system. Faxed required form, clinicals and demographics to [facility].

## 2019-03-22 NOTE — Telephone Encounter (Signed)
Can we call patient tomorrow and tell him his labs didn't have crystals but we're going to refer him to rheumatology? And can we place that referral, per Dr. March Rummage.

## 2019-03-24 LAB — SYNOVIAL FLUID PANEL

## 2019-04-05 ENCOUNTER — Ambulatory Visit: Payer: BC Managed Care – PPO | Admitting: Podiatry

## 2019-05-07 DIAGNOSIS — M064 Inflammatory polyarthropathy: Secondary | ICD-10-CM | POA: Diagnosis not present

## 2019-05-07 DIAGNOSIS — M2550 Pain in unspecified joint: Secondary | ICD-10-CM | POA: Diagnosis not present

## 2019-05-07 DIAGNOSIS — L409 Psoriasis, unspecified: Secondary | ICD-10-CM | POA: Diagnosis not present

## 2019-05-09 ENCOUNTER — Other Ambulatory Visit: Payer: Self-pay | Admitting: Podiatry

## 2019-06-27 DIAGNOSIS — L4052 Psoriatic arthritis mutilans: Secondary | ICD-10-CM | POA: Diagnosis not present

## 2019-06-27 DIAGNOSIS — L400 Psoriasis vulgaris: Secondary | ICD-10-CM | POA: Diagnosis not present

## 2019-06-27 DIAGNOSIS — L408 Other psoriasis: Secondary | ICD-10-CM | POA: Diagnosis not present

## 2019-07-23 DIAGNOSIS — L4050 Arthropathic psoriasis, unspecified: Secondary | ICD-10-CM | POA: Diagnosis not present

## 2019-07-23 DIAGNOSIS — M2550 Pain in unspecified joint: Secondary | ICD-10-CM | POA: Diagnosis not present

## 2019-07-23 DIAGNOSIS — E790 Hyperuricemia without signs of inflammatory arthritis and tophaceous disease: Secondary | ICD-10-CM | POA: Diagnosis not present

## 2019-07-23 DIAGNOSIS — L409 Psoriasis, unspecified: Secondary | ICD-10-CM | POA: Diagnosis not present

## 2019-07-24 DIAGNOSIS — Z79899 Other long term (current) drug therapy: Secondary | ICD-10-CM | POA: Diagnosis not present

## 2019-07-24 DIAGNOSIS — L408 Other psoriasis: Secondary | ICD-10-CM | POA: Diagnosis not present

## 2019-07-24 DIAGNOSIS — L4052 Psoriatic arthritis mutilans: Secondary | ICD-10-CM | POA: Diagnosis not present

## 2019-07-24 DIAGNOSIS — L400 Psoriasis vulgaris: Secondary | ICD-10-CM | POA: Diagnosis not present

## 2019-08-23 DIAGNOSIS — Z789 Other specified health status: Secondary | ICD-10-CM | POA: Diagnosis not present

## 2019-08-23 DIAGNOSIS — Z0000 Encounter for general adult medical examination without abnormal findings: Secondary | ICD-10-CM | POA: Diagnosis not present

## 2019-08-23 DIAGNOSIS — Z23 Encounter for immunization: Secondary | ICD-10-CM | POA: Diagnosis not present

## 2019-08-31 ENCOUNTER — Other Ambulatory Visit: Payer: Self-pay | Admitting: Podiatry

## 2019-10-07 DIAGNOSIS — Z0000 Encounter for general adult medical examination without abnormal findings: Secondary | ICD-10-CM | POA: Diagnosis not present

## 2019-10-07 DIAGNOSIS — Z1389 Encounter for screening for other disorder: Secondary | ICD-10-CM | POA: Diagnosis not present

## 2019-10-07 DIAGNOSIS — Z23 Encounter for immunization: Secondary | ICD-10-CM | POA: Diagnosis not present

## 2019-10-07 DIAGNOSIS — E785 Hyperlipidemia, unspecified: Secondary | ICD-10-CM | POA: Diagnosis not present

## 2019-10-29 ENCOUNTER — Other Ambulatory Visit: Payer: Self-pay

## 2019-10-29 NOTE — Telephone Encounter (Signed)
error 

## 2019-10-30 NOTE — Telephone Encounter (Signed)
This encounter was created in error - please disregard.

## 2019-11-07 ENCOUNTER — Ambulatory Visit: Payer: BC Managed Care – PPO | Admitting: Dermatology

## 2019-11-07 ENCOUNTER — Other Ambulatory Visit: Payer: Self-pay

## 2019-11-07 DIAGNOSIS — L578 Other skin changes due to chronic exposure to nonionizing radiation: Secondary | ICD-10-CM | POA: Diagnosis not present

## 2019-11-07 DIAGNOSIS — L409 Psoriasis, unspecified: Secondary | ICD-10-CM

## 2019-11-07 MED ORDER — OTEZLA 30 MG PO TABS: 30.0000 mg | ORAL_TABLET | Freq: Two times a day (BID) | ORAL | 2 refills | Status: DC

## 2019-11-07 MED ORDER — CALCIPOTRIENE 0.005 % EX FOAM: CUTANEOUS | 6 refills | Status: DC

## 2019-11-07 MED ORDER — MOMETASONE FUROATE 0.1 % EX CREA: TOPICAL_CREAM | CUTANEOUS | 6 refills | Status: DC

## 2019-11-07 NOTE — Progress Notes (Signed)
   Follow-Up Visit   Subjective  Trevor Walker is a 54 y.o. male who presents for the following: Psoriasis (4 months f/u psoriasis on knees, elbows, legs, pt taking Otezla 30mg  bid ).  Trevor Walker is doing very well on R[place] Nail and is pleased with the results.  He has not had any significant side effects and has no headaches diarrhea or depression.  He still has some involvement with recent flare on his legs and arms.  He has not been any using any topical medications.  The following portions of the chart were reviewed this encounter and updated as appropriate:     Review of Systems: No other skin or systemic complaints.  Objective  Well appearing patient in no apparent distress; mood and affect are within normal limits.  A focused examination was performed including face, arms, legs, buttock . Relevant physical exam findings are noted in the Assessment and Plan.  Objective  elbows,legs, knees, perianal area: Mild plaques on the elbows with crust and excoriations. Pink macules on the legs, active areas on the posterior thighs  Scalp clear today  Active area with dyschromia in the perianal area    Assessment & Plan   Actinic Damage - diffuse scaly erythematous macules with underlying dyspigmentation - Recommend daily broad spectrum sunscreen SPF 30+ to sun-exposed areas, reapply every 2 hours as needed.  - Call for new or changing lesions.  Psoriasis elbows,legs, knees, perianal area With recent flare on his legs and arms.  Overall he is still doing much better on the [place] than previously.  We will continue Otezla and restart his topical medications. Psoriasis  BSA 7%  Cont Otezla 30mg  bid  Cont Calcipotriene foam apply to skin bid  Cont Mometasone cream apply to skin bid   May consider going on a biologic if not continuing to do better on Otezla      Apremilast (OTEZLA) 30 MG TABS - elbows,legs, knees, perianal area  Ordered Medications: mometasone (ELOCON) 0.1 %  cream Calcipotriene 0.005 % FOAM   Return in about 6 months (around 05/08/2020) for Psoriasis .   IMarye Round, CMA, am acting as scribe for [name], MD .

## 2019-11-08 ENCOUNTER — Telehealth: Payer: Self-pay

## 2019-11-08 NOTE — Telephone Encounter (Signed)
I called and spoke with the patient in regards to the fax we received from his pharmacy about switching to an alternative Calcipotriene sol. instead of foam since it is not covered by his insurance. Advised patient that we will contact him next week with Dr. Alveria Apley response.

## 2019-11-12 NOTE — Telephone Encounter (Signed)
If Sorilux foam not available,  May have Calcipotriene solution. Same instructions and Rfs

## 2019-11-13 MED ORDER — CALCIPOTRIENE 0.005 % EX SOLN: 1.0000 "application " | Freq: Every day | CUTANEOUS | 2 refills | Status: DC

## 2020-02-03 ENCOUNTER — Other Ambulatory Visit: Payer: Self-pay

## 2020-02-03 MED ORDER — OTEZLA 30 MG PO TABS: 30.0000 mg | ORAL_TABLET | Freq: Two times a day (BID) | ORAL | 5 refills | Status: DC

## 2020-02-03 NOTE — Progress Notes (Signed)
Refill request from [place] approved.

## 2020-04-02 DIAGNOSIS — Z23 Encounter for immunization: Secondary | ICD-10-CM | POA: Diagnosis not present

## 2020-04-17 DIAGNOSIS — Z1152 Encounter for screening for COVID-19: Secondary | ICD-10-CM | POA: Diagnosis not present

## 2020-04-18 DIAGNOSIS — Z03818 Encounter for observation for suspected exposure to other biological agents ruled out: Secondary | ICD-10-CM | POA: Diagnosis not present

## 2020-04-18 DIAGNOSIS — Z20822 Contact with and (suspected) exposure to covid-19: Secondary | ICD-10-CM | POA: Diagnosis not present

## 2020-05-13 ENCOUNTER — Other Ambulatory Visit: Payer: Self-pay

## 2020-05-13 ENCOUNTER — Ambulatory Visit (INDEPENDENT_AMBULATORY_CARE_PROVIDER_SITE_OTHER): Payer: BC Managed Care – PPO | Admitting: Dermatology

## 2020-05-13 DIAGNOSIS — L409 Psoriasis, unspecified: Secondary | ICD-10-CM | POA: Diagnosis not present

## 2020-05-13 DIAGNOSIS — L4050 Arthropathic psoriasis, unspecified: Secondary | ICD-10-CM | POA: Diagnosis not present

## 2020-05-13 DIAGNOSIS — L408 Other psoriasis: Secondary | ICD-10-CM | POA: Diagnosis not present

## 2020-05-13 NOTE — Progress Notes (Signed)
   Follow-Up Visit   Subjective  Trevor Walker is a 54 y.o. male who presents for the following: Psoriasis. Patient is currently using Otezla 30mg  po BID with no S.E., Mometasone 0.1% cream BID PRN, and Calcipotriene solution to aa's QD PRN. Although his psoriasis has improved he still continues to flare even while on treatment.   The following portions of the chart were reviewed this encounter and updated as appropriate:  Tobacco  Allergies  Meds  Problems  Med Hx  Surg Hx  Fam Hx     Review of Systems:  No other skin or systemic complaints except as noted in HPI or Assessment and Plan.  Objective  Well appearing patient in no apparent distress; mood and affect are within normal limits.  A focused examination was performed including the trunk and extremities. Relevant physical exam findings are noted in the Assessment and Plan.  Objective  B/L elbows, arms, buttocks: Crusting and pink plaques on the elbows and arms, a few on the scalp area. Plaques of the buttocks.    Assessment & Plan  Psoriasis -severe, on systemic oral Otezla but not doing well with flares.  He would like to consider other treatment options. B/L elbows, arms, buttocks With psoriasis inversa and PsA of the toes (improved on Otezla); flaring on the arms, elbows, and buttocks today, but significantly improved since starting Otezla - BSA 10% (on current treatment) off treatment BSA was 15-20%   Discussed with patient may flare during cooler months, and times of stress (mental and physical). Recommend Orson Ape (this should help his skin significantly which is his main concern.  It also should help his psoriatic arthritis which is relatively mild. )  Will start insurance authorization process.  If not approved recommend Cosentyx or Taltz. Pamphlets gave for all three.   Continue Otezla 30mg  po BID, Mometasone 0.1% cream to aa's BID PRN, and Calcipotriene solution to aa's BID PRN.   Side effects of Otezla  (apremilast) include diarrhea, nausea, headache, upper respiratory infection, depression, and weight decrease (5-10%). It should only be taken by pregnant women after a discussion regarding risks and benefits with their doctor. Goal is control of skin condition, not cure.  The use of Rutherford Nail requires long term medication management, including periodic office visits.  Topical steroids (such as triamcinolone, fluocinolone, fluocinonide, mometasone, clobetasol, halobetasol, betamethasone, hydrocortisone) can cause thinning and lightening of the skin if they are used for too long in the same area. Your physician has selected the right strength medicine for your problem and area affected on the body. Please use your medication only as directed by your physician to prevent side effects.    Other Related Medications Apremilast (OTEZLA) 30 MG TABS mometasone (ELOCON) 0.1 % cream Risankizumab-rzaa,150 MG Dose, (SKYRIZI, 150 MG DOSE,) 75 MG/0.83ML PSKT  Return in about 1 month (around 06/13/2020) for F/U appt. - psoriasis .  [name], CMA, am acting as scribe for [name], MD .  Documentation: I have reviewed the above documentation for accuracy and completeness, and I agree with the above.  [name], MD

## 2020-05-13 NOTE — Patient Instructions (Signed)
Side effects of Otezla (apremilast) include diarrhea, nausea, headache, upper respiratory infection, depression, and weight decrease (5-10%). It should only be taken by pregnant women after a discussion regarding risks and benefits with their doctor. Goal is control of skin condition, not cure.  The use of Otezla requires long term medication management, including periodic office visits.  Topical steroids (such as triamcinolone, fluocinolone, fluocinonide, mometasone, clobetasol, halobetasol, betamethasone, hydrocortisone) can cause thinning and lightening of the skin if they are used for too long in the same area. Your physician has selected the right strength medicine for your problem and area affected on the body. Please use your medication only as directed by your physician to prevent side effects.   

## 2020-05-14 ENCOUNTER — Encounter: Payer: Self-pay | Admitting: Dermatology

## 2020-05-14 ENCOUNTER — Other Ambulatory Visit: Payer: Self-pay

## 2020-05-14 DIAGNOSIS — L409 Psoriasis, unspecified: Secondary | ICD-10-CM

## 2020-05-14 MED ORDER — SKYRIZI (150 MG DOSE) 75 MG/0.83ML ~~LOC~~ PSKT
150.0000 mg | PREFILLED_SYRINGE | SUBCUTANEOUS | 1 refills | Status: DC
Start: 2020-05-14 — End: 2020-05-19

## 2020-05-14 NOTE — Progress Notes (Signed)
Rx sent per last visit note, 05/13/20.

## 2020-05-19 ENCOUNTER — Other Ambulatory Visit: Payer: Self-pay

## 2020-05-19 DIAGNOSIS — L409 Psoriasis, unspecified: Secondary | ICD-10-CM

## 2020-05-19 MED ORDER — SKYRIZI 150 MG/ML ~~LOC~~ SOSY: 150.0000 mg | PREFILLED_SYRINGE | SUBCUTANEOUS | 3 refills | Status: DC

## 2020-05-19 MED ORDER — SKYRIZI 150 MG/ML ~~LOC~~ SOSY: 150.0000 mg | PREFILLED_SYRINGE | SUBCUTANEOUS | 1 refills | Status: DC

## 2020-05-19 NOTE — Progress Notes (Signed)
Dosage update

## 2020-05-25 DIAGNOSIS — Z23 Encounter for immunization: Secondary | ICD-10-CM | POA: Diagnosis not present

## 2020-06-11 ENCOUNTER — Ambulatory Visit: Payer: BC Managed Care – PPO | Admitting: Dermatology

## 2020-06-16 ENCOUNTER — Other Ambulatory Visit: Payer: Self-pay

## 2020-06-16 DIAGNOSIS — L409 Psoriasis, unspecified: Secondary | ICD-10-CM

## 2020-06-16 MED ORDER — SKYRIZI 150 MG/ML ~~LOC~~ SOSY: 150.0000 mg | PREFILLED_SYRINGE | SUBCUTANEOUS | 1 refills | Status: DC

## 2020-06-16 MED ORDER — SKYRIZI 150 MG/ML ~~LOC~~ SOSY: 150.0000 mg | PREFILLED_SYRINGE | SUBCUTANEOUS | 3 refills | Status: DC

## 2020-06-16 NOTE — Progress Notes (Signed)
Per Iantha Fallen, rx needs to be sent to [facility]

## 2020-07-08 ENCOUNTER — Ambulatory Visit: Payer: BC Managed Care – PPO | Admitting: Dermatology

## 2020-07-08 ENCOUNTER — Other Ambulatory Visit: Payer: Self-pay

## 2020-07-08 DIAGNOSIS — L408 Other psoriasis: Secondary | ICD-10-CM

## 2020-07-08 DIAGNOSIS — L409 Psoriasis, unspecified: Secondary | ICD-10-CM | POA: Diagnosis not present

## 2020-07-08 DIAGNOSIS — L4052 Psoriatic arthritis mutilans: Secondary | ICD-10-CM

## 2020-07-08 NOTE — Progress Notes (Deleted)
   Follow-Up Visit   Subjective  Trevor Walker is a 54 y.o. male who presents for the following: Psoriasis (1 month f/u Psoriasis on arms, legs, groin treating with Rutherford Nail with a fair response ). Patient is currently using Otezla 30mg  po BID with no S.E., Mometasone 0.1% cream BID PRN, and Calcipotriene solution to aa's QD PRN. Although his psoriasis has improved he still continues to flare even while on treatment.   The following portions of the chart were reviewed this encounter and updated as appropriate:     Review of Systems:  No other skin or systemic complaints except as noted in HPI or Assessment and Plan.  Objective  Well appearing patient in no apparent distress; mood and affect are within normal limits.  A focused examination was performed including the trunk and extremities. Relevant physical exam findings are noted in the Assessment and Plan.     Assessment & Plan  Psoriasis -severe, on systemic oral Otezla but not doing well with flares.  He would like to consider other treatment options. B/L elbows, arms, buttocks With psoriasis inversa and PsA of the toes (improved on Otezla); flaring on the arms, elbows, and buttocks today, but significantly improved since starting Otezla - BSA 10% (on current treatment) off treatment BSA was 15-20%   Discussed with patient may flare during cooler months, and times of stress (mental and physical). Recommend Orson Ape (this should help his skin significantly which is his main concern.  It also should help his psoriatic arthritis which is relatively mild. )  Will start insurance authorization process.  If not approved recommend Cosentyx or Taltz. Pamphlets gave for all three.   Continue Otezla 30mg  po BID, Mometasone 0.1% cream to aa's BID PRN, and Calcipotriene solution to aa's BID PRN.   Side effects of Otezla (apremilast) include diarrhea, nausea, headache, upper respiratory infection, depression, and weight decrease (5-10%). It should  only be taken by pregnant women after a discussion regarding risks and benefits with their doctor. Goal is control of skin condition, not cure.  The use of Rutherford Nail requires long term medication management, including periodic office visits.  Topical steroids (such as triamcinolone, fluocinolone, fluocinonide, mometasone, clobetasol, halobetasol, betamethasone, hydrocortisone) can cause thinning and lightening of the skin if they are used for too long in the same area. Your physician has selected the right strength medicine for your problem and area affected on the body. Please use your medication only as directed by your physician to prevent side effects.     Return for return to start [place] .  IMarye Round, CMA, am acting as scribe for [name], MD .  IDocumentation: I have reviewed the above documentation for accuracy and completeness, and I agree with the above.  [name], MD

## 2020-07-09 NOTE — Progress Notes (Signed)
   Follow-Up Visit   Subjective  Trevor Walker is a 54 y.o. male who presents for the following: Psoriasis (1 month f/u Psoriasis on arms, legs, groin treating with Rutherford Nail with a poor response ). Pt here to discuss starting Skyrizi injection  Pt was seen by his Rheumatologist Dr Trevor Walker and was diagnosed with Psoriatic arthritis.   The following portions of the chart were reviewed this encounter and updated as appropriate:  Tobacco  Allergies  Meds  Problems  Med Hx  Surg Hx  Fam Hx     Review of Systems:  No other skin or systemic complaints except as noted in HPI or Assessment and Plan.  Objective  Well appearing patient in no apparent distress; mood and affect are within normal limits.  A focused examination was performed including face, exts, trunk . Relevant physical exam findings are noted in the Assessment and Plan.  Objective  elbows, arms, buttocks: Crusting and pink plaques on the elbows and arms, a few on the scalp area. Plaques of the buttocks.      Assessment & Plan  Psoriasis and psoriasis inversa of skin with Psoriatic Arthritis of toes - flared and not to goal elbows, arms, buttocks Psoriasis -severe, on systemic oral Otezla but not doing well with flares.  He would like to consider other treatment options.  With psoriasis inversa and PsA of the toes (improved on Otezla); flaring on the arms, elbows, and buttocks today, but significantly improved since starting Otezla - BSA 10% (on current treatment) off treatment BSA was 15-20%   Labs ordered CBC with diff, CMP, Hep B surface antibody, Hep C Antibody, Hiv antibody, Quantiferon-TB gold plus  Pending normal labs and shipment of Skyrizi return to the office for [name] injection training   Psoriasis is a chronic non-curable, but treatable genetic/hereditary disease that may have other systemic features affecting other organ systems such as joints (Psoriatic Arthritis). It is associated with an increased  risk of inflammatory bowel disease, heart disease, non-alcoholic fatty liver disease, and depression.    Other Related Procedures CBC with Differential/Platelets CMP Hep B Surface Antibody Hep C Antibody HIV antibody (with reflex) QuantiFERON-TB Gold Plus  Other Related Medications mometasone (ELOCON) 0.1 % cream Risankizumab-rzaa (SKYRIZI) 150 MG/ML SOSY  Return for return to start [place] .  IMarye Walker, CMA, am acting as scribe for Trevor Ser, MD .  Documentation: I have reviewed the above documentation for accuracy and completeness, and I agree with the above.  Trevor Ser, MD

## 2020-07-10 LAB — QUANTIFERON-TB GOLD PLUS
QuantiFERON Mitogen Value: 3.23 IU/mL
QuantiFERON Nil Value: 0.01 IU/mL
QuantiFERON TB1 Ag Value: 0.01 IU/mL
QuantiFERON TB2 Ag Value: 0.02 IU/mL
QuantiFERON-TB Gold Plus: NEGATIVE

## 2020-07-10 LAB — CBC WITH DIFFERENTIAL/PLATELET
Basophils Absolute: 0 10*3/uL (ref 0.0–0.2)
Basos: 0 %
EOS (ABSOLUTE): 0.1 10*3/uL (ref 0.0–0.4)
Eos: 1 %
Hematocrit: 41.4 % (ref 37.5–51.0)
Hemoglobin: 14.8 g/dL (ref 13.0–17.7)
Immature Grans (Abs): 0 10*3/uL (ref 0.0–0.1)
Immature Granulocytes: 0 %
Lymphocytes Absolute: 0.8 10*3/uL (ref 0.7–3.1)
Lymphs: 12 %
MCH: 29.3 pg (ref 26.6–33.0)
MCHC: 35.7 g/dL (ref 31.5–35.7)
MCV: 82 fL (ref 79–97)
Monocytes Absolute: 0.6 10*3/uL (ref 0.1–0.9)
Monocytes: 9 %
Neutrophils Absolute: 5.2 10*3/uL (ref 1.4–7.0)
Neutrophils: 78 %
Platelets: 218 10*3/uL (ref 150–450)
RBC: 5.05 x10E6/uL (ref 4.14–5.80)
RDW: 12.7 % (ref 11.6–15.4)
WBC: 6.7 10*3/uL (ref 3.4–10.8)

## 2020-07-10 LAB — COMPREHENSIVE METABOLIC PANEL
ALT: 15 IU/L (ref 0–44)
AST: 16 IU/L (ref 0–40)
Albumin/Globulin Ratio: 2.4 — ABNORMAL HIGH (ref 1.2–2.2)
Albumin: 4.7 g/dL (ref 3.8–4.9)
Alkaline Phosphatase: 75 IU/L (ref 44–121)
BUN/Creatinine Ratio: 10 (ref 9–20)
BUN: 12 mg/dL (ref 6–24)
Bilirubin Total: 0.6 mg/dL (ref 0.0–1.2)
CO2: 25 mmol/L (ref 20–29)
Calcium: 9.4 mg/dL (ref 8.7–10.2)
Chloride: 102 mmol/L (ref 96–106)
Creatinine, Ser: 1.26 mg/dL (ref 0.76–1.27)
GFR calc Af Amer: 74 mL/min/{1.73_m2} (ref >59)
GFR calc non Af Amer: 64 mL/min/{1.73_m2} (ref >59)
Globulin, Total: 2 g/dL (ref 1.5–4.5)
Glucose: 106 mg/dL — ABNORMAL HIGH (ref 65–99)
Potassium: 4.3 mmol/L (ref 3.5–5.2)
Sodium: 139 mmol/L (ref 134–144)
Total Protein: 6.7 g/dL (ref 6.0–8.5)

## 2020-07-10 LAB — HIV ANTIBODY (ROUTINE TESTING W REFLEX): HIV Screen 4th Generation wRfx: NONREACTIVE

## 2020-07-10 LAB — HEPATITIS C ANTIBODY: Hep C Virus Ab: 0.2 s/co ratio (ref 0.0–0.9)

## 2020-07-10 LAB — HEPATITIS B SURFACE ANTIBODY,QUALITATIVE: Hep B Surface Ab, Qual: NONREACTIVE

## 2020-07-13 ENCOUNTER — Encounter: Payer: Self-pay | Admitting: Dermatology

## 2020-07-14 ENCOUNTER — Telehealth: Payer: Self-pay

## 2020-07-14 NOTE — Telephone Encounter (Signed)
Called pt discussed labs ok to start [place], pt will call and have Skyrizi sent here, then we will call him to get him set up for training here in the office once we get the skyrizi in the office

## 2020-07-22 ENCOUNTER — Other Ambulatory Visit: Payer: Self-pay

## 2020-07-22 ENCOUNTER — Ambulatory Visit: Payer: BC Managed Care – PPO | Admitting: Dermatology

## 2020-07-22 DIAGNOSIS — L409 Psoriasis, unspecified: Secondary | ICD-10-CM | POA: Diagnosis not present

## 2020-07-22 MED ORDER — RISANKIZUMAB-RZAA 150 MG/ML ~~LOC~~ SOSY
150.0000 mg | PREFILLED_SYRINGE | Freq: Once | SUBCUTANEOUS | Status: AC
  Administered 2020-07-22: 150 mg via SUBCUTANEOUS

## 2020-07-22 NOTE — Progress Notes (Signed)
   Follow-Up Visit   Subjective  Trevor Walker is a 54 y.o. male who presents for the following: Psoriasis (Pt here to get started on Skyrizi for psoriasis on arms, legs, groin ). Pt currently taking Otezla with improvement, but overall an inadequate response. Flared currently.  The following portions of the chart were reviewed this encounter and updated as appropriate:   Tobacco  Allergies  Meds  Problems  Med Hx  Surg Hx  Fam Hx     Review of Systems:  No other skin or systemic complaints except as noted in HPI or Assessment and Plan.  Objective  Well appearing patient in no apparent distress; mood and affect are within normal limits.  A focused examination was performed including trunk,exts . Relevant physical exam findings are noted in the Assessment and Plan.  Objective  exts, trunk: Crusting and pink plaques on the elbows and arms, a few on the scalp area. Plaques of the buttocks.    Assessment & Plan  Psoriasis - improved on Otezla oral treatment, but overall an inadequate response and currently flared exts, trunk Psoriasis - severe on systemic "biologic" treatment injections.  Psoriasis is a chronic non-curable, but treatable genetic/hereditary disease that may have other systemic features affecting other organ systems such as joints (Psoriatic Arthritis).  It is linked with heart disease, inflammatory bowel disease, non-alcoholic fatty liver disease, and depression. Significant skin psoriasis and/or psoriatic arthritis may have significant symptoms and affects activities of daily activity and often benefits from systemic "biologic" injection treatments.  These "biologic" treatments have some potential side effects including immunosuppression and require pre-treatment laboratory screening and periodic laboratory monitoring and periodic in person evaluation and monitoring by the attending dermatologist physician.  BSA 10%  On Otezla treatment  D/C Otezla tablets   Start  Skyrizi 150 mg/ml injected at L abdomen, pt tolerated well  Lot [id number] Exp 08/2021  Other Related Medications mometasone (ELOCON) 0.1 % cream  Return in about 4 weeks (around 08/19/2020) for [name].  IMarye Round, CMA, am acting as scribe for [name], MD .  Documentation: I have reviewed the above documentation for accuracy and completeness, and I agree with the above.  [name], MD

## 2020-07-27 ENCOUNTER — Encounter: Payer: Self-pay | Admitting: Dermatology

## 2020-08-19 ENCOUNTER — Ambulatory Visit: Payer: BC Managed Care – PPO

## 2020-08-19 ENCOUNTER — Other Ambulatory Visit: Payer: Self-pay

## 2020-08-19 DIAGNOSIS — L409 Psoriasis, unspecified: Secondary | ICD-10-CM | POA: Diagnosis not present

## 2020-08-19 MED ORDER — RISANKIZUMAB-RZAA 150 MG/ML ~~LOC~~ SOSY
150.0000 mg | PREFILLED_SYRINGE | Freq: Once | SUBCUTANEOUS | Status: AC
  Administered 2020-08-19: 150 mg via SUBCUTANEOUS

## 2020-08-19 NOTE — Progress Notes (Signed)
Week 4 dose Skyrizi 150 mg/ml injected at R abdomen Pt tolerated well.  Lot [id number] Exp 08/2021

## 2020-09-02 ENCOUNTER — Telehealth: Payer: Self-pay

## 2020-09-02 NOTE — Telephone Encounter (Signed)
The patient's clinical picture and past response to medication is consistent with Psoriasis. His clinical picture is not consistent with Dermatitis Herpetiformis.   Nevertheless, the diagnosis of Psoriasis does not preclude an additional diagnosis of Dermatitis Herpetiformis. If he is concerned about this possibility, we can do 2 biopsies which can tell us if there is any evidence of Dermatitis Herpetiformis and we can perform these biopsies any time if he desires.

## 2020-09-02 NOTE — Telephone Encounter (Signed)
Patient called regarding his current psoriasis treatment of Skyrizi. Patient states after doing some research he just wants to confirm with you that you do not think or believe he could have dermatitis herpetiformis. That's about all the patient left me with in the phone call message.

## 2020-09-03 NOTE — Telephone Encounter (Signed)
Patient advised of information per Dr. Nehemiah Massed.  He has been scheduled for an appointment to come in for the biopsies.

## 2020-09-23 ENCOUNTER — Other Ambulatory Visit: Payer: Self-pay | Admitting: Dermatology

## 2020-09-23 ENCOUNTER — Other Ambulatory Visit: Payer: Self-pay

## 2020-09-23 ENCOUNTER — Ambulatory Visit: Payer: BC Managed Care – PPO | Admitting: Dermatology

## 2020-09-23 ENCOUNTER — Encounter: Payer: Self-pay | Admitting: Dermatology

## 2020-09-23 DIAGNOSIS — L409 Psoriasis, unspecified: Secondary | ICD-10-CM

## 2020-09-23 DIAGNOSIS — R21 Rash and other nonspecific skin eruption: Secondary | ICD-10-CM

## 2020-09-23 DIAGNOSIS — B079 Viral wart, unspecified: Secondary | ICD-10-CM

## 2020-09-23 DIAGNOSIS — L308 Other specified dermatitis: Secondary | ICD-10-CM | POA: Diagnosis not present

## 2020-09-23 NOTE — Progress Notes (Signed)
Follow-Up Visit   Subjective  Trevor Walker is a 55 y.o. male who presents for the following: Psoriasis (Arms, legs, groin, axilla, scalp, Skyrizi x 2 doses, /Pt inquiring if this could be Dermatitis herpetiformis, pt has gotten "liquid blisters", pt has not noticed bowel issues with gluten) and growths (R neck, few months).  The following portions of the chart were reviewed this encounter and updated as appropriate:   Tobacco  Allergies  Meds  Problems  Med Hx  Surg Hx  Fam Hx     Review of Systems:  No other skin or systemic complaints except as noted in HPI or Assessment and Plan.  Objective  Well appearing patient in no apparent distress; mood and affect are within normal limits.  A focused examination was performed including arms, legs, groin, trunk. Relevant physical exam findings are noted in the Assessment and Plan.  Objective  L forearm proximal, Left Forearm distal, Left Thigh - Posterior: Patchy erythema with crusting elbows, legs  Images                Objective  buttocks, axilla: Pinkness with paps and crusts  Images    Objective  R neck x 3 (3): Verrucous papules -- Discussed viral etiology and contagion.    Assessment & Plan  Rash (3) - R/O Dermatitis Herpetiformis vs Atopic Dermatitis vs Psoriasis (2 biopsies for H&E and 1 for DIF) L forearm proximal; Left Forearm distal; Left Thigh - Posterior  Skin / nail biopsy - L forearm proximal Type of biopsy: punch   Informed consent: discussed and consent obtained   Timeout: patient name, date of birth, surgical site, and procedure verified   Procedure prep:  Patient was prepped and draped in usual sterile fashion (The patient was cleaned and prepped) Prep type:  Isopropyl alcohol Anesthesia: the lesion was anesthetized in a standard fashion   Anesthetic:  1% lidocaine w/ epinephrine 1-100,000 buffered w/ 8.4% NaHCO3 Punch size:  3 mm Suture size:  4-0 Suture type: nylon   Hemostasis  achieved with: suture, pressure and aluminum chloride   Outcome: patient tolerated procedure well   Post-procedure details: sterile dressing applied and wound care instructions given   Dressing type: bandage, pressure dressing and bacitracin    Skin / nail biopsy - Left Forearm distal Type of biopsy: punch   Informed consent: discussed and consent obtained   Timeout: patient name, date of birth, surgical site, and procedure verified   Procedure prep:  Patient was prepped and draped in usual sterile fashion (The patient was cleaned and prepped) Prep type:  Isopropyl alcohol Anesthesia: the lesion was anesthetized in a standard fashion   Anesthetic:  1% lidocaine w/ epinephrine 1-100,000 buffered w/ 8.4% NaHCO3 Punch size:  3 mm Suture size:  4-0 Suture type: nylon   Hemostasis achieved with: suture, pressure and aluminum chloride   Outcome: patient tolerated procedure well   Post-procedure details: sterile dressing applied and wound care instructions given   Dressing type: bandage, pressure dressing and bacitracin    Skin / nail biopsy - Left Thigh - Posterior Type of biopsy: punch   Informed consent: discussed and consent obtained   Timeout: patient name, date of birth, surgical site, and procedure verified   Procedure prep:  Patient was prepped and draped in usual sterile fashion (The patient was cleaned and prepped) Prep type:  Isopropyl alcohol Anesthesia: the lesion was anesthetized in a standard fashion   Anesthetic:  1% lidocaine w/ epinephrine 1-100,000 buffered w/  8.4% NaHCO3 Punch size:  3 mm Suture size:  4-0 Suture type: nylon   Hemostasis achieved with: suture, pressure and aluminum chloride   Outcome: patient tolerated procedure well   Post-procedure details: sterile dressing applied and wound care instructions given   Dressing type: bandage, pressure dressing and bacitracin    Specimen 1 - Surgical pathology Differential Diagnosis: D48.5 Psoriasis vs Eczema vs  Dermatitis Herpetiformis vs other  Check Margins: No Patchy erythema with crusting elbows, legs  Specimen 2 - Surgical pathology Differential Diagnosis: D48.5 Psoriasis vs Eczema vs Dermatitis Herpetiformis vs other Michele's solution Check Margins: No Patchy erythema with crusting elbows, legs  Specimen 3 - Surgical pathology Differential Diagnosis: D48.5 Psoriasis vs Eczema vs Dermatitis Herpetiformis vs other  Check Margins: No Patchy erythema with crusting elbows, legs  Psoriasis buttocks, axilla With Psoriasis Inversa Clinically consistent with Psoriasis and responded well to [place] in past. Cont Skyriza sq injections for now.  May make changes depending on path reports. Psoriasis - severe on systemic "biologic" treatment injections.  Psoriasis is a chronic non-curable, but treatable genetic/hereditary disease that may have other systemic features affecting other organ systems such as joints (Psoriatic Arthritis).  It is linked with heart disease, inflammatory bowel disease, non-alcoholic fatty liver disease, and depression. Significant skin psoriasis and/or psoriatic arthritis may have significant symptoms and affects activities of daily activity and often benefits from systemic "biologic" injection treatments.  These "biologic" treatments have some potential side effects including immunosuppression and require pre-treatment laboratory screening and periodic laboratory monitoring and periodic in person evaluation and monitoring by the attending dermatologist physician.  Other Related Medications mometasone (ELOCON) 0.1 % cream  Viral warts, unspecified type (3) R neck x 3 Vs other - recheck next visit  Destruction of lesion - R neck x 3 Complexity: simple   Destruction method: cryotherapy   Informed consent: discussed and consent obtained   Timeout:  patient name, date of birth, surgical site, and procedure verified Lesion destroyed using liquid nitrogen: Yes   Region  frozen until ice ball extended beyond lesion: Yes   Outcome: patient tolerated procedure well with no complications   Post-procedure details: wound care instructions given    Return in about 1 week (around 09/30/2020) for bx f/u.  I, Othelia Pulling, RMA, am acting as scribe for [name], MD .  Documentation: I have reviewed the above documentation for accuracy and completeness, and I agree with the above.  [name], MD

## 2020-09-23 NOTE — Patient Instructions (Signed)

## 2020-10-01 ENCOUNTER — Ambulatory Visit: Payer: BC Managed Care – PPO | Admitting: Dermatology

## 2020-10-01 ENCOUNTER — Other Ambulatory Visit: Payer: Self-pay

## 2020-10-01 DIAGNOSIS — L308 Other specified dermatitis: Secondary | ICD-10-CM

## 2020-10-01 MED ORDER — CEPHALEXIN 500 MG PO CAPS
500.0000 mg | ORAL_CAPSULE | Freq: Two times a day (BID) | ORAL | 0 refills | Status: AC
Start: 2020-10-01 — End: 2020-10-08

## 2020-10-01 MED ORDER — DUPIXENT 300 MG/2ML ~~LOC~~ SOSY: 300.0000 mg | PREFILLED_SYRINGE | SUBCUTANEOUS | 6 refills | Status: DC

## 2020-10-01 MED ORDER — DUPILUMAB 300 MG/2ML ~~LOC~~ SOSY
600.0000 mg | PREFILLED_SYRINGE | Freq: Once | SUBCUTANEOUS | Status: AC
  Administered 2020-10-01: 600 mg via SUBCUTANEOUS

## 2020-10-01 MED ORDER — MUPIROCIN 2 % EX OINT: TOPICAL_OINTMENT | CUTANEOUS | 2 refills | Status: DC

## 2020-10-01 NOTE — Progress Notes (Signed)
   Follow-Up Visit   Subjective  Trevor Walker is a 55 y.o. male who presents for the following: Suture / Staple Removal. L proximal forearm discuss biopsy results and treatment plan.   The following portions of the chart were reviewed this encounter and updated as appropriate:   Tobacco  Allergies  Meds  Problems  Med Hx  Surg Hx  Fam Hx     Review of Systems:  No other skin or systemic complaints except as noted in HPI or Assessment and Plan.  Objective  Well appearing patient in no apparent distress; mood and affect are within normal limits.  A focused examination was performed including bilateral arms . Relevant physical exam findings are noted in the Assessment and Plan.  Objective  Left Forearm proximal: Scaly erythematous papules and patches +/- dyspigmentation, lichenification, excoriations.    Assessment & Plan  Eczema /atopic dermatitis-biopsy-proven and severe Pathology shows spongiotic dermatitis with vesiculation.  DIF is negative (not consistent with dermatitis herpetiformis). Patient has severe pruritus. Reviewed that his clinical picture has been more consistent with psoriasis with psoriasis inversa, but his pathology is more consistent with eczema/atopic dermatitis and that he may have more of a psoriasis/atopic dermatitis overlap which may be why he did respond to [place], but not to [name]. Nevertheless, because his histopathologic picture is more consistent with atopic dermatitis, we should give him a trial of Dupixent (versus Adbry) Left Forearm proximal Biopsy results discussed  Spongiotic Vesicular dermatitis More consistent with Eczema changes Can try Eczema shot treatment (Dupixent or Adbry) to see how responds  Negative DIF NOT consistent with Dermatitis Herpetiformis NO cure for can only control   Atopic dermatitis (eczema) is a chronic, relapsing, pruritic condition that can significantly affect quality of life. It is often associated with  allergic rhinitis and/or asthma and can require treatment with topical medications, phototherapy, or in severe cases a biologic medication called Dupixent in older children and adults.   Encounter for Removal of Sutures - Incision site at the L forearm is clean, dry and intact - Wound cleansed, sutures removed, wound cleansed and steri strips applied.  - Discussed pathology results showing Eczema  - Patient advised to keep steri-strips dry until they fall off. - Scars remodel for a full year. - Once steri-strips fall off, patient can apply over-the-counter silicone scar cream each night to help with scar remodeling if desired. - Patient advised to call with any concerns or if they notice any new or changing lesions.   Dupilumab (Dupixent) is a treatment given by injection for adults with moderate-to-severe atopic dermatitis. Goal is control of skin condition, not cure. It is given as 2 injections at the first dose followed by 1 injection ever 2 weeks thereafter.  Potential side effects include allergic reaction, herpes infections, injection site reactions and conjunctivitis (inflammation of the eyes).  The use of Dupixent requires long term medication management, including periodic office visits.   D/C Skyrizi   Dupixent 300 mg 2 ml injection given L upper arm, R upper arm  Lot #[id number] 08/2022 Dupixent [place] form completed   Ordered Medications: cephALEXin (KEFLEX) 500 MG capsule mupirocin ointment (BACTROBAN) 2 %  Return in about 2 weeks (around 10/15/2020) for Dupixent injection .  IMarye Round, CMA, am acting as scribe for [name], MD .  Documentation: I have reviewed the above documentation for accuracy and completeness, and I agree with the above.  [name], MD

## 2020-10-03 ENCOUNTER — Encounter: Payer: Self-pay | Admitting: Dermatology

## 2020-10-13 DIAGNOSIS — E785 Hyperlipidemia, unspecified: Secondary | ICD-10-CM | POA: Diagnosis not present

## 2020-10-13 DIAGNOSIS — Z131 Encounter for screening for diabetes mellitus: Secondary | ICD-10-CM | POA: Diagnosis not present

## 2020-10-13 DIAGNOSIS — Z0000 Encounter for general adult medical examination without abnormal findings: Secondary | ICD-10-CM | POA: Diagnosis not present

## 2020-10-13 DIAGNOSIS — Z125 Encounter for screening for malignant neoplasm of prostate: Secondary | ICD-10-CM | POA: Diagnosis not present

## 2020-10-15 ENCOUNTER — Other Ambulatory Visit: Payer: Self-pay

## 2020-10-15 ENCOUNTER — Ambulatory Visit: Payer: BC Managed Care – PPO | Admitting: Dermatology

## 2020-10-15 DIAGNOSIS — L209 Atopic dermatitis, unspecified: Secondary | ICD-10-CM | POA: Diagnosis not present

## 2020-10-15 MED ORDER — DUPILUMAB 300 MG/2ML ~~LOC~~ SOSY
300.0000 mg | PREFILLED_SYRINGE | Freq: Once | SUBCUTANEOUS | Status: AC
Start: 2020-10-15 — End: 2020-10-15
  Administered 2020-10-15: 300 mg via SUBCUTANEOUS

## 2020-10-15 NOTE — Progress Notes (Signed)
   Follow-Up Visit   Subjective  Trevor Walker is a 55 y.o. male who presents for the following: Eczema (Patient is here today for his Dupixent 300mg /48mL injection. He has already noticed an improvement in his atopic dermatitis.).  The following portions of the chart were reviewed this encounter and updated as appropriate:   Tobacco  Allergies  Meds  Problems  Med Hx  Surg Hx  Fam Hx     Review of Systems:  No other skin or systemic complaints except as noted in HPI or Assessment and Plan.  Objective  Well appearing patient in no apparent distress; mood and affect are within normal limits.  A focused examination was performed including the arms and legs. Relevant physical exam findings are noted in the Assessment and Plan.   Assessment & Plan  Atopic dermatitis -already improving with systemic Dupixent injections Trunk, extremities  Eczema /atopic dermatitis-biopsy-proven and severe Pathology shows spongiotic dermatitis with vesiculation.  DIF is negative (not consistent with dermatitis herpetiformis). Patient has severe pruritus. Reviewed that his clinical picture has been more consistent with psoriasis with psoriasis inversa, but his pathology is more consistent with eczema/atopic dermatitis and that he may have more of a psoriasis/atopic dermatitis overlap which may be why he did respond to [name], but not to [name]. Nevertheless, because his histopathologic picture is more consistent with atopic dermatitis, we have started him on a trial of Dupixent.  Atopic dermatitis - Severe, on Dupixent (biologic medication).  Atopic dermatitis (eczema) is a chronic, relapsing, pruritic condition that can significantly affect quality of life. It is often associated with allergic rhinitis and/or asthma and can require treatment with topical medications, phototherapy, or in severe cases a biologic medication called Dupixent.    Continue Mupirocin 2% ointment daily to any open sores.    Dupixent 300mg /59mL injected SQ into the R upper arm posterior. Patient tolerated injection well. AL, CMA   dupilumab (DUPIXENT) prefilled syringe 300 mg - Trunk, extremities  Return in about 2 weeks (around 10/29/2020) for Dupixent injection.  Trevor Walker, CMA, am acting as scribe for Trevor Ser, MD .  Documentation: I have reviewed the above documentation for accuracy and completeness, and I agree with the above.  Trevor Ser, MD

## 2020-10-27 ENCOUNTER — Encounter: Payer: Self-pay | Admitting: Dermatology

## 2020-10-29 ENCOUNTER — Other Ambulatory Visit: Payer: Self-pay

## 2020-10-29 ENCOUNTER — Ambulatory Visit: Payer: BC Managed Care – PPO

## 2020-10-29 DIAGNOSIS — L209 Atopic dermatitis, unspecified: Secondary | ICD-10-CM | POA: Diagnosis not present

## 2020-10-29 MED ORDER — DUPILUMAB 300 MG/2ML ~~LOC~~ SOSY
300.0000 mg | PREFILLED_SYRINGE | Freq: Once | SUBCUTANEOUS | Status: AC
  Administered 2020-10-29: 300 mg via SUBCUTANEOUS

## 2020-10-29 NOTE — Progress Notes (Signed)
Patient here for two week Dupixent injection.   Dupixent 300mg /17mL injected SQ into left arm. Patient tolerated well.   LOT: [id number] EXP: 08/2022

## 2020-11-12 ENCOUNTER — Ambulatory Visit (INDEPENDENT_AMBULATORY_CARE_PROVIDER_SITE_OTHER): Payer: BC Managed Care – PPO

## 2020-11-12 ENCOUNTER — Other Ambulatory Visit: Payer: Self-pay

## 2020-11-12 DIAGNOSIS — L209 Atopic dermatitis, unspecified: Secondary | ICD-10-CM

## 2020-11-12 MED ORDER — DUPILUMAB 300 MG/2ML ~~LOC~~ SOSY
300.0000 mg | PREFILLED_SYRINGE | Freq: Once | SUBCUTANEOUS | Status: AC
  Administered 2020-11-12: 300 mg via SUBCUTANEOUS

## 2020-11-12 NOTE — Progress Notes (Signed)
Patient here for two week Dupixent injection.  Dupixent 300mg /53mL injected into right arm. Patient tolerated well.   LOT: [id number] EXP: 10/07/22

## 2020-11-18 ENCOUNTER — Ambulatory Visit: Payer: BC Managed Care – PPO

## 2020-11-23 ENCOUNTER — Other Ambulatory Visit: Payer: Self-pay | Admitting: Geriatric Medicine

## 2020-11-23 ENCOUNTER — Ambulatory Visit
Admission: RE | Admit: 2020-11-23 | Discharge: 2020-11-23 | Disposition: A | Discharge status: Home / Self Care | Payer: BC Managed Care – PPO | Source: Ambulatory Visit | Attending: Geriatric Medicine | Admitting: Geriatric Medicine

## 2020-11-23 DIAGNOSIS — M542 Cervicalgia: Secondary | ICD-10-CM

## 2020-11-23 DIAGNOSIS — R0789 Other chest pain: Secondary | ICD-10-CM

## 2020-11-26 ENCOUNTER — Ambulatory Visit (INDEPENDENT_AMBULATORY_CARE_PROVIDER_SITE_OTHER): Payer: BC Managed Care – PPO | Admitting: Dermatology

## 2020-11-26 ENCOUNTER — Other Ambulatory Visit: Payer: Self-pay

## 2020-11-26 DIAGNOSIS — L2081 Atopic neurodermatitis: Secondary | ICD-10-CM | POA: Diagnosis not present

## 2020-11-26 DIAGNOSIS — S50812A Abrasion of left forearm, initial encounter: Secondary | ICD-10-CM | POA: Diagnosis not present

## 2020-11-26 DIAGNOSIS — T148XXA Other injury of unspecified body region, initial encounter: Secondary | ICD-10-CM

## 2020-11-26 MED ORDER — OPZELURA 1.5 % EX CREA: 1.0000 "application " | TOPICAL_CREAM | Freq: Every day | CUTANEOUS | 3 refills | Status: DC

## 2020-11-26 MED ORDER — DUPIXENT 300 MG/2ML ~~LOC~~ SOAJ: 1.0000 "pen " | SUBCUTANEOUS | 6 refills | Status: DC

## 2020-11-26 MED ORDER — DUPILUMAB 300 MG/2ML ~~LOC~~ SOSY
300.0000 mg | PREFILLED_SYRINGE | Freq: Once | SUBCUTANEOUS | Status: AC
  Administered 2020-11-26: 300 mg via SUBCUTANEOUS
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
## 2020-11-26 NOTE — Patient Instructions (Signed)

## 2020-12-01 ENCOUNTER — Encounter: Payer: Self-pay | Admitting: Dermatology

## 2020-12-10 ENCOUNTER — Ambulatory Visit (INDEPENDENT_AMBULATORY_CARE_PROVIDER_SITE_OTHER): Payer: BC Managed Care – PPO

## 2020-12-10 ENCOUNTER — Other Ambulatory Visit: Payer: Self-pay

## 2020-12-10 DIAGNOSIS — L209 Atopic dermatitis, unspecified: Secondary | ICD-10-CM | POA: Diagnosis not present

## 2020-12-10 MED ORDER — DUPILUMAB 300 MG/2ML ~~LOC~~ SOSY
300.0000 mg | PREFILLED_SYRINGE | Freq: Once | SUBCUTANEOUS | Status: AC
  Administered 2020-12-10: 300 mg via SUBCUTANEOUS

## 2020-12-10 NOTE — Progress Notes (Signed)
Patient here today for two week Dupixent injection.  Dupixent 300mg /mL injected into right upper arm. Patient tolerated well. Patient had one syringe left in the fridge. We injected that today so it did not go to waste. Spoke with patient about injection training and showed how to use with training pen. Patient states he is comfortable injecting at home. Patient scheduled for 6 month follow up.   LOT: [id number] EXP: 10/2022

## 2020-12-17 DIAGNOSIS — Z0189 Encounter for other specified special examinations: Secondary | ICD-10-CM | POA: Diagnosis not present

## 2021-05-11 DIAGNOSIS — Z23 Encounter for immunization: Secondary | ICD-10-CM | POA: Diagnosis not present

## 2021-06-01 ENCOUNTER — Other Ambulatory Visit: Payer: Self-pay | Admitting: Dermatology

## 2021-06-01 DIAGNOSIS — L2081 Atopic neurodermatitis: Secondary | ICD-10-CM

## 2021-06-02 ENCOUNTER — Other Ambulatory Visit: Payer: Self-pay

## 2021-06-02 ENCOUNTER — Ambulatory Visit: Payer: BC Managed Care – PPO | Admitting: Dermatology

## 2021-06-02 DIAGNOSIS — L2081 Atopic neurodermatitis: Secondary | ICD-10-CM

## 2021-06-02 NOTE — Progress Notes (Signed)
   Follow-Up Visit   Subjective  Trevor Walker is a 55 y.o. male who presents for the following: Eczema (Trunk, extremities, Dupixent 300mg /42ml q 2wks, Opzelura prn, improved). The patient is still itching a lot at night and waking up at night itching.  He has scratches on his buttocks and legs.  Although this is the case, he feels like he is much improved on Dupixent since starting.  The following portions of the chart were reviewed this encounter and updated as appropriate:   Tobacco  Allergies  Meds  Problems  Med Hx  Surg Hx  Fam Hx     Review of Systems:  No other skin or systemic complaints except as noted in HPI or Assessment and Plan.  Objective  Well appearing patient in no apparent distress; mood and affect are within normal limits.  A focused examination was performed including buttocks legs arms face. Relevant physical exam findings are noted in the Assessment and Plan.  trunk, extremities Excoriations and  patchy erythema legs buttocks and forearms        Assessment & Plan  Atopic neurodermatitis -much improved on Dupixent but still significant involvement with excoriations on the buttocks legs and arms and the patient is still waking up at night itching and scratching trunk, extremities  Atopic dermatitis - Severe, on Dupixent (biologic medication).  Atopic dermatitis (eczema) is a chronic, relapsing, pruritic condition that can significantly affect quality of life. It is often associated with allergic rhinitis and/or asthma and can require treatment with topical medications, phototherapy, or in severe cases a biologic medication called Dupixent.    Improved but not to goal  Cont Dupixent 300mg /24ml sq injections q 2 wks Cont Opzelura cr qd/bid aa prn flares  Discussed changing medication to Adbry, Rinvoq,  Recommend Rinvoq if patient wants to try a different medication for better improvement.  Info on Rinvoq given to patient.  Advised we will need to do  laboratory evaluation prior to starting if he wants to pursue this.  There is some immunosuppressant effects with Rinvoq, as well as liver and blood potential side effects.  He has no history of liver or kidney disease or history of congestive heart failure or COPD or diabetes.  Related Medications Dupilumab (DUPIXENT) 300 MG/2ML SOPN Inject 1 pen into the skin every 14 (fourteen) days.  OPZELURA 1.5 % CREA APPLY ONE APPLICATION TOPICALLY AT BEDTIME TO  AFFECTED AREA ECZEMA BODY  Return in about 5 months (around 10/31/2021) for Atopic Derm.  I, Othelia Pulling, RMA, am acting as scribe for [name], MD . Documentation: I have reviewed the above documentation for accuracy and completeness, and I agree with the above.  [name], MD

## 2021-06-02 NOTE — Patient Instructions (Signed)

## 2021-06-03 ENCOUNTER — Encounter: Payer: Self-pay | Admitting: Dermatology

## 2021-06-05 ENCOUNTER — Other Ambulatory Visit: Payer: Self-pay | Admitting: Dermatology

## 2021-06-05 DIAGNOSIS — L2081 Atopic neurodermatitis: Secondary | ICD-10-CM

## 2021-10-26 DIAGNOSIS — R9389 Abnormal findings on diagnostic imaging of other specified body structures: Secondary | ICD-10-CM | POA: Diagnosis not present

## 2021-10-26 DIAGNOSIS — L309 Dermatitis, unspecified: Secondary | ICD-10-CM | POA: Diagnosis not present

## 2021-10-26 DIAGNOSIS — R7301 Impaired fasting glucose: Secondary | ICD-10-CM | POA: Diagnosis not present

## 2021-10-26 DIAGNOSIS — K219 Gastro-esophageal reflux disease without esophagitis: Secondary | ICD-10-CM | POA: Diagnosis not present

## 2021-10-26 DIAGNOSIS — R7989 Other specified abnormal findings of blood chemistry: Secondary | ICD-10-CM | POA: Diagnosis not present

## 2021-10-26 DIAGNOSIS — E785 Hyperlipidemia, unspecified: Secondary | ICD-10-CM | POA: Diagnosis not present

## 2021-10-26 DIAGNOSIS — R918 Other nonspecific abnormal finding of lung field: Secondary | ICD-10-CM | POA: Diagnosis not present

## 2021-10-26 DIAGNOSIS — F17200 Nicotine dependence, unspecified, uncomplicated: Secondary | ICD-10-CM | POA: Diagnosis not present

## 2021-10-26 DIAGNOSIS — Z0000 Encounter for general adult medical examination without abnormal findings: Secondary | ICD-10-CM | POA: Diagnosis not present

## 2021-11-02 ENCOUNTER — Other Ambulatory Visit: Payer: Self-pay

## 2021-11-02 ENCOUNTER — Ambulatory Visit: Payer: BC Managed Care – PPO | Admitting: Dermatology

## 2021-11-02 DIAGNOSIS — L2089 Other atopic dermatitis: Secondary | ICD-10-CM

## 2021-11-02 DIAGNOSIS — L209 Atopic dermatitis, unspecified: Secondary | ICD-10-CM | POA: Diagnosis not present

## 2021-11-02 DIAGNOSIS — L299 Pruritus, unspecified: Secondary | ICD-10-CM

## 2021-11-02 DIAGNOSIS — R21 Rash and other nonspecific skin eruption: Secondary | ICD-10-CM

## 2021-11-02 DIAGNOSIS — Z79899 Other long term (current) drug therapy: Secondary | ICD-10-CM | POA: Diagnosis not present

## 2021-11-02 DIAGNOSIS — D229 Melanocytic nevi, unspecified: Secondary | ICD-10-CM

## 2021-11-02 DIAGNOSIS — D225 Melanocytic nevi of trunk: Secondary | ICD-10-CM | POA: Diagnosis not present

## 2021-11-02 NOTE — Progress Notes (Signed)
? ?  Follow-Up Visit ?  ?Subjective  ?Trevor Walker is a 56 y.o. male who presents for the following: Atopic dermatitis  (Patient currently using Dupixent '300mg'$ /44m SQ Q2W and OTC eczema lotion. Patient flares in the axilla, groin, buttocks, legs, and elbows. Patient states Opzelura didn't help very much and the cost for quantity was also an issue. Due to flares he hasn't been able to sleep at night and condition has caused a poor quality of life. ). ?He is [employer] [facility] and initially helped him and he had significant improvement.  He flared a few months ago and has not been able to recover. ? ?The following portions of the chart were reviewed this encounter and updated as appropriate:  ? Tobacco  Allergies  Meds  Problems  Med Hx  Surg Hx  Fam Hx   ?  ?Review of Systems:  No other skin or systemic complaints except as noted in HPI or Assessment and Plan. ? ?Objective  ?Well appearing patient in no apparent distress; mood and affect are within normal limits. ? ?A focused examination was performed including the trunk, face, and extremities. Relevant physical exam findings are noted in the Assessment and Plan. ? ?Trunk, extremities ?Patches of pinkness with multiple excoriations and edema of the arms and legs (see photos).  ? ? ? ? ? ? ? ? ? ? ? ? ? ? ? ? ? ? ?L scapula ?Irregular brown macule.  ? ? ? ? ? ? ? ?Assessment & Plan  ?Atopic dermatitis with pruritus and excoriations ?Trunk, extremities ?Severe, chronic, and persistent condition with duration or expected duration over one year. Condition is bothersome/symptomatic for patient. Currently flared.  ?Bx proven "spongiotic dermatitis".  ?Reviewed labs from 10/26/21 WNL ?Atopic dermatitis (eczema) is a chronic, relapsing, pruritic condition that can significantly affect quality of life. It is often associated with allergic rhinitis and/or asthma and can require treatment with topical medications, phototherapy, or in severe cases biologic injectable medication  (Dupixent; Adbry) or Oral JAK inhibitors. ? ?Discussed starting Rinvoq or Cibinqo since patient continues to flare even while on Dupixent. Discussed mild immunosuppression and the need for frequent labs.  ? ?Pending labs and insurance approval start Rinvoq.  ? ?Discussed patch testing to rule out contact dermatitis - will apply today. Reviewed with patient that he will need to return in 2 days and then again in 7 days.  ? ?QuantiFERON-TB Gold Plus - Trunk, extremities ? ?Patch Test - Trunk, extremities ? ?Nevus -dark and irregular ?L scapula ?Plan shave biopsy at future appointments.  ? ?Return for patch testing follow up in 2 days and 7 days with Dr. KNehemiah Massed ? ?I, ARudell Cobb CMA, am acting as scribe for DSarina Ser MD . ?Documentation: I have reviewed the above documentation for accuracy and completeness, and I agree with the above. ? ?DSarina Ser MD ? ?

## 2021-11-02 NOTE — Patient Instructions (Signed)

## 2021-11-04 ENCOUNTER — Ambulatory Visit (INDEPENDENT_AMBULATORY_CARE_PROVIDER_SITE_OTHER): Payer: BC Managed Care – PPO | Admitting: Dermatology

## 2021-11-04 ENCOUNTER — Encounter: Payer: Self-pay | Admitting: Dermatology

## 2021-11-04 DIAGNOSIS — R21 Rash and other nonspecific skin eruption: Secondary | ICD-10-CM

## 2021-11-04 LAB — QUANTIFERON-TB GOLD PLUS
QuantiFERON Mitogen Value: >10 IU/mL
QuantiFERON Nil Value: 0.04 IU/mL
QuantiFERON TB1 Ag Value: 0.02 IU/mL
QuantiFERON TB2 Ag Value: 0.02 IU/mL
QuantiFERON-TB Gold Plus: NEGATIVE

## 2021-11-04 NOTE — Progress Notes (Signed)
Patient here today for day three of patch test reading for atopic dermatitis -rule out contact dermatitis ?Patient has questionable reaction to site #6, fragrance mix.  Photos taken and placed in media. ? ?Patient advised how to read panels Friday - Monday and do not soak or scrub back/testing area.  ? ?Trevor Walker, Trevor Walker ?Documentation: I have reviewed the above documentation for accuracy and completeness, and I agree with the above. ? ?[name], MD ? ?

## 2021-11-05 ENCOUNTER — Encounter: Payer: Self-pay | Admitting: Dermatology

## 2021-11-09 ENCOUNTER — Ambulatory Visit: Payer: BC Managed Care – PPO | Admitting: Dermatology

## 2021-11-09 ENCOUNTER — Encounter: Payer: Self-pay | Admitting: Dermatology

## 2021-11-09 DIAGNOSIS — D225 Melanocytic nevi of trunk: Secondary | ICD-10-CM

## 2021-11-09 DIAGNOSIS — D239 Other benign neoplasm of skin, unspecified: Secondary | ICD-10-CM

## 2021-11-09 DIAGNOSIS — L2489 Irritant contact dermatitis due to other agents: Secondary | ICD-10-CM

## 2021-11-09 DIAGNOSIS — D492 Neoplasm of unspecified behavior of bone, soft tissue, and skin: Secondary | ICD-10-CM

## 2021-11-09 DIAGNOSIS — L2081 Atopic neurodermatitis: Secondary | ICD-10-CM | POA: Diagnosis not present

## 2021-11-09 HISTORY — DX: Other benign neoplasm of skin, unspecified: D23.9

## 2021-11-09 MED ORDER — RINVOQ 15 MG PO TB24: 1.0000 | ORAL_TABLET | Freq: Every day | ORAL | 3 refills | Status: DC

## 2021-11-09 NOTE — Progress Notes (Signed)
? ?  Follow-Up Visit ?  ?Subjective  ?Trevor Walker is a 56 y.o. male who presents for the following: Atopic derm r/o Contact derm (1 wk f/u True Patch test 36, previous reading had questionable reaction to #6 fragrance mix, pt noticed panel 3 the whole area looked irritated and red). ?The patient has spots, moles and lesions to be evaluated, some may be new or changing and the patient has concerns that these could be cancer. ? ?The following portions of the chart were reviewed this encounter and updated as appropriate:  ? Tobacco  Allergies  Meds  Problems  Med Hx  Surg Hx  Fam Hx   ?  ?Review of Systems:  No other skin or systemic complaints except as noted in HPI or Assessment and Plan. ? ?Objective  ?Well appearing patient in no apparent distress; mood and affect are within normal limits. ? ?A focused examination was performed including back. Relevant physical exam findings are noted in the Assessment and Plan. ? ?trunk, extemities ?No reaction noted to True Patch test 36 ? ?R low back ?Photo showed erythema all around True Patch Test 36 panal 3 but the boxes were clear ? ?L scapula ?Irregular brown macule 0.6cm ? ? ? ? ? ?Assessment & Plan  ?Atopic neurodermatitis ?trunk, extemities ? ?Severe, chronic, and persistent condition with duration or expected duration over one year. Condition is bothersome/symptomatic for patient. Currently flared.  ?Bx proven "spongiotic dermatitis".  ?Patch Test x36 "true test" is negative after 1 week. ?He has failed Dupixent course for over 1 year. ?Atopic dermatitis (eczema) is a chronic, relapsing, pruritic condition that can significantly affect quality of life. It is often associated with allergic rhinitis and/or asthma and can require treatment with topical medications, phototherapy, or in severe cases biologic injectable medication (Dupixent; Adbry) or Oral JAK inhibitors.  ? ?TB test from 11/02/21 negative ?Negative True Patch Test 36 ?Discussed expanded patch  testing at [facility], but not likely productive at this time. ? ?D/c Dupixent ? ?Start Rinvoq'15mg'$  1 po qd, sample x 1 Lot [id number], 10/28/2023 ? ?Upadacitinib ER (RINVOQ) 15 MG TB24 - trunk, extemities ?Take 1 tablet by mouth daily. 1 po qd ? ?Irritant contact dermatitis due to other agents ?R low back ?Most likely 2ndary to tape from patch tests.  There were no positive allergens on Patch Test x36 "true Test" ? ?Neoplasm of skin ?L scapula ?Epidermal / dermal shaving ? ?Lesion diameter (cm):  0.6 ?Informed consent: discussed and consent obtained   ?Timeout: patient name, date of birth, surgical site, and procedure verified   ?Procedure prep:  Patient was prepped and draped in usual sterile fashion ?Prep type:  Isopropyl alcohol ?Anesthesia: the lesion was anesthetized in a standard fashion   ?Anesthetic:  1% lidocaine w/ epinephrine 1-100,000 buffered w/ 8.4% NaHCO3 ?Instrument used: flexible razor blade   ?Hemostasis achieved with: pressure, aluminum chloride and electrodesiccation   ?Outcome: patient tolerated procedure well   ?Post-procedure details: sterile dressing applied and wound care instructions given   ?Dressing type: bandage and petrolatum   ? ?Specimen 1 - Surgical pathology ?Differential Diagnosis: D48.5 Nevus vs Dysplastic Nevus ? ?Check Margins: yes ?Irregular brown macule 0.6cm ? ?Return in about 3 months (around 02/08/2022) for Atopic Derm. ? ?I, Othelia Pulling, RMA, am acting as scribe for [name], MD . ?Documentation: I have reviewed the above documentation for accuracy and completeness, and I agree with the above. ? ?[name], MD ? ?

## 2021-11-09 NOTE — Patient Instructions (Addendum)
Wound Care Instructions ? ?Cleanse wound gently with soap and water once a day then pat dry with clean gauze. Apply a thing coat of Petrolatum (petroleum jelly, "Vaseline") over the wound (unless you have an allergy to this). We recommend that you use a new, sterile tube of Vaseline. Do not pick or remove scabs. Do not remove the yellow or white "healing tissue" from the base of the wound. ? ?Cover the wound with fresh, clean, nonstick gauze and secure with paper tape. You may use Band-Aids in place of gauze and tape if the would is small enough, but would recommend trimming much of the tape off as there is often too much. Sometimes Band-Aids can irritate the skin. ? ?You should call the office for your biopsy report after 1 week if you have not already been contacted. ? ?If you experience any problems, such as abnormal amounts of bleeding, swelling, significant bruising, significant pain, or evidence of infection, please call the office immediately. ? ?FOR ADULT SURGERY PATIENTS: If you need something for pain relief you may take 1 extra strength Tylenol (acetaminophen) AND 2 Ibuprofen ('200mg'$  each) together every 4 hours as needed for pain. (do not take these if you are allergic to them or if you have a reason you should not take them.) Typically, you may only need pain medication for 1 to 3 days.  ? ?Rinvoq '15mg'$  take 1 pill a day  ? ? ?If You Need Anything After Your Visit ? ?If you have any questions or concerns for your doctor, please call our main line at [phone] and press option 4 to reach your doctor's medical assistant. If no one answers, please leave a voicemail as directed and we will return your call as soon as possible. Messages left after 4 pm will be answered the following business day.  ? ?You may also send [place] a message via MyChart. We typically respond to MyChart messages within 1-2 business days. ? ?For prescription refills, please ask your pharmacy to contact our office. Our fax number is  [phone]. ? ?If you have an urgent issue when the clinic is closed that cannot wait until the next business day, you can page your doctor at the number below.   ? ?Please note that while we do our best to be available for urgent issues outside of office hours, we are not available 24/7.  ? ?If you have an urgent issue and are unable to reach [place], you may choose to seek medical care at your doctor's office, retail clinic, urgent care center, or emergency room. ? ?If you have a medical emergency, please immediately call 911 or go to the emergency department. ? ?Pager Numbers ? ?- Dr. Nehemiah Massed: [phone] ? ?- Dr. Laurence Ferrari: [phone] ? ?- Dr. Nicole Kindred: [phone] ? ?In the event of inclement weather, please call our main line at [phone] for an update on the status of any delays or closures. ? ?Dermatology Medication Tips: ?Please keep the boxes that topical medications come in in order to help keep track of the instructions about where and how to use these. Pharmacies typically print the medication instructions only on the boxes and not directly on the medication tubes.  ? ?If your medication is too expensive, please contact our office at [phone] option 4 or send [place] a message through [place].  ? ?We are unable to tell what your co-pay for medications will be in advance as this is different depending on your insurance coverage. However, we may be able to  find a substitute medication at lower cost or fill out paperwork to get insurance to cover a needed medication.  ? ?If a prior authorization is required to get your medication covered by your insurance company, please allow [place] 1-2 business days to complete this process. ? ?Drug prices often vary depending on where the prescription is filled and some pharmacies may offer cheaper prices. ? ?The website www.goodrx.com contains coupons for medications through different pharmacies. The prices here do not account for what the cost may be with help from  insurance (it may be cheaper with your insurance), but the website can give you the price if you did not use any insurance.  ?- You can print the associated coupon and take it with your prescription to the pharmacy.  ?- You may also stop by our office during regular business hours and pick up a GoodRx coupon card.  ?- If you need your prescription sent electronically to a different pharmacy, notify our office through [facility] or by phone at [phone] option 4. ? ? ? ? ?Si Usted Necesita Algo Despu?s de Su Visita ? ?Tambi?n puede enviarnos un mensaje a trav?s de MyChart. Por lo general respondemos a los mensajes de MyChart en el transcurso de 1 a 2 d?as h?biles. ? ?Para renovar recetas, por favor pida a su farmacia que se ponga en contacto con nuestra oficina. Nuestro n?mero de fax es el [phone]. ? ?Si tiene un asunto urgente cuando la cl?nica est? cerrada y que no puede esperar hasta el siguiente d?a h?bil, puede llamar/localizar a su doctor(a) al n?mero que aparece a continuaci?n.  ? ?Por favor, tenga en cuenta que aunque hacemos todo lo posible para estar disponibles para asuntos urgentes fuera del horario de oficina, no estamos disponibles las 24 horas del d?a, los 7 d?as de la semana.  ? ?Si tiene un problema urgente y no puede comunicarse con nosotros, puede optar por buscar atenci?n m?dica  en el consultorio de su doctor(a), en una cl?nica privada, en un centro de atenci?n urgente o en una sala de emergencias. ? ?Si tiene Engineer, maintenance (IT) m?dica, por favor llame inmediatamente al 911 o vaya a la sala de emergencias. ? ?N?meros de b?per ? ?- Dr. Nehemiah Massed: [phone] ? ?- Dra. Moye: [phone] ? ?- Dra. Nicole Kindred: [phone] ? ?En caso de inclemencias del tiempo, por favor llame a nuestra l?nea principal al [phone] para una actualizaci?n sobre el estado de cualquier retraso o cierre. ? ?Consejos para la medicaci?n en dermatolog?a: ?Por favor, guarde las cajas en las que vienen los  medicamentos de uso t?pico para ayudarle a seguir las instrucciones sobre d?nde y c?mo usarlos. Las farmacias generalmente imprimen las instrucciones del medicamento s?lo en las cajas y no directamente en los tubos del [employer].  ? ?Si su medicamento es muy caro, por favor, p?ngase en contacto con Zigmund Daniel llamando al [phone] y presione la opci?n 4 o env?enos un mensaje a trav?s de MyChart.  ? ?No podemos decirle cu?l ser? su copago por los medicamentos por adelantado ya que esto es diferente dependiendo de la cobertura de su seguro. Sin embargo, es posible que podamos encontrar un medicamento sustituto a Electrical engineer un formulario para que el seguro cubra el medicamento que se considera necesario.  ? ?Si se requiere Ardelia Mems autorizaci?n previa para que su compa??a de seguros [place] su medicamento, por favor perm?tanos de 1 a 2 d?as h?biles para completar este proceso. ? ?Los precios de los medicamentos var?an con frecuencia dependiendo  del lugar de d?nde se surte la receta y alguna farmacias pueden ofrecer precios m?s baratos. ? ?El sitio web www.goodrx.com tiene cupones para medicamentos de Airline pilot. Los precios aqu? no tienen en cuenta lo que podr?a costar con la ayuda del seguro (puede ser m?s barato con su seguro), pero el sitio web puede darle el precio si no utiliz? ning?n seguro.  ?- Puede imprimir el cup?n correspondiente y llevarlo con su receta a la farmacia.  ?- Tambi?n puede pasar por nuestra oficina durante el horario de atenci?n regular y recoger una tarjeta de cupones de GoodRx.  ?- Si necesita que su receta se env?e electr?nicamente a Chiropodist, informe a nuestra oficina a trav?s de MyChart de [place] o por tel?fono llamando al [phone] y presione la opci?n 4.  ?

## 2021-11-11 ENCOUNTER — Telehealth: Payer: Self-pay

## 2021-11-11 NOTE — Telephone Encounter (Signed)
-----   Message from Ralene Bathe, MD sent at 11/10/2021  6:22 PM EDT ----- ?Diagnosis ?Skin , left scapula ?DYSPLASTIC COMPOUND NEVUS WITH SEVERE ATYPIA, CLOSE TO MARGIN, SEE DESCRIPTION ? ?Severe dysplastic ?Margin clear but "close to margin" ?May need additional procedure ?Will discuss at next visit ?Recheck next visit ?

## 2021-11-11 NOTE — Telephone Encounter (Signed)
Patient informed of pathology results 

## 2021-11-22 ENCOUNTER — Telehealth: Payer: Self-pay

## 2021-11-22 NOTE — Telephone Encounter (Signed)
Patient is out of Rinvoq samples and leaving to go out of town today. Sample left at front desk for him to pick up.  ? ?LOT: [id number] ?EXP: 10/31/23 ? ?Rinvoq start forms also completed today for patient to be eligible for "free drug" if insurance coverage is denied.  ?

## 2021-12-06 ENCOUNTER — Other Ambulatory Visit: Payer: Self-pay

## 2021-12-06 DIAGNOSIS — L2081 Atopic neurodermatitis: Secondary | ICD-10-CM

## 2021-12-06 MED ORDER — RINVOQ 15 MG PO TB24: 1.0000 | ORAL_TABLET | Freq: Every day | ORAL | 3 refills | Status: DC
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
## 2022-02-14 ENCOUNTER — Ambulatory Visit: Payer: BC Managed Care – PPO | Admitting: Dermatology

## 2022-02-14 DIAGNOSIS — Z86018 Personal history of other benign neoplasm: Secondary | ICD-10-CM

## 2022-02-14 DIAGNOSIS — L2081 Atopic neurodermatitis: Secondary | ICD-10-CM | POA: Diagnosis not present

## 2022-02-14 DIAGNOSIS — L409 Psoriasis, unspecified: Secondary | ICD-10-CM

## 2022-02-14 MED ORDER — EUCRISA 2 % EX OINT: 1.0000 | TOPICAL_OINTMENT | Freq: Every day | CUTANEOUS | 3 refills | Status: AC

## 2022-02-14 MED ORDER — MOMETASONE FUROATE 0.1 % EX CREA: TOPICAL_CREAM | CUTANEOUS | 6 refills | Status: DC

## 2022-02-14 NOTE — Progress Notes (Signed)
   Follow-Up Visit   Subjective  Trevor Walker is a 56 y.o. male who presents for the following: atopic neurodermatitis (Rinvoq 15 mg 1 po qd - he is doing better) and Follow-up (Biopsy follow up - left scapula - severe dysplastic nevus margins free but close to margin).  The following portions of the chart were reviewed this encounter and updated as appropriate:   Tobacco  Allergies  Meds  Problems  Med Hx  Surg Hx  Fam Hx     Review of Systems:  No other skin or systemic complaints except as noted in HPI or Assessment and Plan.  Objective  Well appearing patient in no apparent distress; mood and affect are within normal limits.  A focused examination was performed including arms, legs, buttocks, back. Relevant physical exam findings are noted in the Assessment and Plan.  trunk , extremities Few scattered excoriations of post thighs and buttocks. Lichenification of perianal area. Pinkness of forearms and elbows.  Left scapula Pink biopsy site   Assessment & Plan  Atopic neurodermatitis trunk , extremities Improving   Severe, chronic, and persistent condition with duration or expected duration over one year. Condition is bothersome/symptomatic for patient. Bx proven "spongiotic dermatitis".  Patch Test x36 "true test" is negative after 1 week. He has failed Dupixent course for over 1 year. Atopic dermatitis (eczema) is a chronic, relapsing, pruritic condition that can significantly affect quality of life. It is often associated with allergic rhinitis and/or asthma and can require treatment with topical medications, phototherapy, or in severe cases biologic injectable medication (Dupixent; Adbry) or Oral JAK inhibitors.    TB test from 11/02/21 negative Negative True Patch Test 36 Discussed expanded patch testing at [facility], but not likely productive at this time.  Continue Rinvoq'15mg'$  1 po qd, sample x 1 Lot [id number], 10/28/2023   Start Eucrisa oint qd , Mometasone cream  qd up to 5 times per week  Crisaborole (EUCRISA) 2 % OINT - trunk , extremities Apply 1 Application topically daily.  Related Medications Upadacitinib ER (RINVOQ) 15 MG TB24 Take 1 tablet by mouth daily. 1 po qd  History of dysplastic nevus Left scapula  Diagnosis Skin , left scapula DYSPLASTIC COMPOUND NEVUS WITH SEVERE ATYPIA, CLOSE TO MARGIN, SEE DESCRIPTION   Severe dysplastic Margin clear but "close to margin"  Clear today.  Discussed following area closely vs excision. Patient prefers to watch area for recurrence since it appears clear today.  Return in about 3 months (around 05/17/2022) for TBSE.  I, Ashok Cordia, CMA, am acting as scribe for [name], MD . Documentation: I have reviewed the above documentation for accuracy and completeness, and I agree with the above.  [name], MD

## 2022-02-14 NOTE — Patient Instructions (Addendum)
Please have Primary Care Physician add CBC with Dif, LFT and Lipid panel to labs.     Due to recent changes in healthcare laws, you may see results of your pathology and/or laboratory studies on MyChart before the doctors have had a chance to review them. We understand that in some cases there may be results that are confusing or concerning to you. Please understand that not all results are received at the same time and often the doctors may need to interpret multiple results in order to provide you with the best plan of care or course of treatment. Therefore, we ask that you please give [place] 2 business days to thoroughly review all your results before contacting the office for clarification. Should we see a critical lab result, you will be contacted sooner.   If You Need Anything After Your Visit  If you have any questions or concerns for your doctor, please call our main line at [phone] and press option 4 to reach your doctor's medical assistant. If no one answers, please leave a voicemail as directed and we will return your call as soon as possible. Messages left after 4 pm will be answered the following business day.   You may also send [place] a message via [place]. We typically respond to MyChart messages within 1-2 business days.  For prescription refills, please ask your pharmacy to contact our office. Our fax number is [phone].  If you have an urgent issue when the clinic is closed that cannot wait until the next business day, you can page your doctor at the number below.    Please note that while we do our best to be available for urgent issues outside of office hours, we are not available 24/7.   If you have an urgent issue and are unable to reach [place], you may choose to seek medical care at your doctor's office, retail clinic, urgent care center, or emergency room.  If you have a medical emergency, please immediately call 911 or go to the emergency department.  Pager Numbers  -  Dr. Nehemiah Massed: [phone]  - Dr. Laurence Ferrari: [phone]  - Dr. Nicole Kindred: [phone]  In the event of inclement weather, please call our main line at [phone] for an update on the status of any delays or closures.  Dermatology Medication Tips: Please keep the boxes that topical medications come in in order to help keep track of the instructions about where and how to use these. Pharmacies typically print the medication instructions only on the boxes and not directly on the medication tubes.   If your medication is too expensive, please contact our office at [phone] option 4 or send [place] a message through [place].   We are unable to tell what your co-pay for medications will be in advance as this is different depending on your insurance coverage. However, we may be able to find a substitute medication at lower cost or fill out paperwork to get insurance to cover a needed medication.   If a prior authorization is required to get your medication covered by your insurance company, please allow [place] 1-2 business days to complete this process.  Drug prices often vary depending on where the prescription is filled and some pharmacies may offer cheaper prices.  The website www.goodrx.com contains coupons for medications through different pharmacies. The prices here do not account for what the cost may be with help from insurance (it may be cheaper with your insurance), but the website can give you the price  if you did not use any insurance.  - You can print the associated coupon and take it with your prescription to the pharmacy.  - You may also stop by our office during regular business hours and pick up a GoodRx coupon card.  - If you need your prescription sent electronically to a different pharmacy, notify our office through [facility] or by phone at [phone] option 4.     Si Usted Necesita Algo Despus de Su Visita  Tambin puede enviarnos un mensaje a travs de Pharmacist, community. Por  lo general respondemos a los mensajes de MyChart en el transcurso de 1 a 2 das hbiles.  Para renovar recetas, por favor pida a su farmacia que se ponga en contacto con nuestra oficina. Harland Dingwall de fax es [place] [phone].  Si tiene un asunto urgente cuando la clnica est cerrada y que no puede esperar hasta el siguiente da hbil, puede llamar/localizar a su doctor(a) al nmero que aparece a continuacin.   Por favor, tenga en cuenta que aunque hacemos todo lo posible para estar disponibles para asuntos urgentes fuera del horario de [place], no estamos disponibles las 24 horas del da, los 7 das de la [place].   Si tiene un problema urgente y no puede comunicarse con nosotros, puede optar por buscar atencin mdica  en el consultorio de su doctor(a), en una clnica privada, en un centro de atencin urgente o en una sala de emergencias.  Si tiene Engineering geologist, por favor llame inmediatamente al 911 o vaya a la sala de emergencias.  Nmeros de bper  - Dr. Nehemiah Massed: [phone]  - Dra. Moye: [phone]  - Dra. Nicole Kindred: [phone]  En caso de inclemencias del [place], por favor llame a Johnsie Kindred principal al [phone] para una actualizacin sobre el [place] de cualquier retraso o cierre.  Consejos para la medicacin en dermatologa: Por favor, guarde las cajas en las que vienen los medicamentos de uso tpico para ayudarle a seguir las instrucciones sobre dnde y cmo usarlos. Las farmacias generalmente imprimen las instrucciones del medicamento slo en las cajas y no directamente en los tubos del [place].   Si su medicamento es muy caro, por favor, pngase en contacto con Zigmund Daniel llamando al [phone] y presione la opcin 4 o envenos un mensaje a travs de Pharmacist, community.   No podemos decirle cul ser su copago por los medicamentos por adelantado ya que esto es diferente dependiendo de la cobertura de su seguro. Sin embargo, es posible que podamos encontrar  un medicamento sustituto a Electrical engineer un formulario para que el seguro cubra el medicamento que se considera necesario.   Si se requiere una autorizacin previa para que su compaa de seguros [place] su medicamento, por favor permtanos de 1 a 2 das hbiles para completar este proceso.  Los precios de los medicamentos varan con frecuencia dependiendo del Environmental consultant de dnde se surte la receta y alguna farmacias pueden ofrecer precios ms baratos.  El sitio web www.goodrx.com tiene cupones para medicamentos de Airline pilot. Los precios aqu no tienen en cuenta lo que podra costar con la ayuda del seguro (puede ser ms barato con su seguro), pero el sitio web puede darle el precio si no utiliz Research scientist (physical sciences).  - Puede imprimir el cupn correspondiente y llevarlo con su receta a la farmacia.  - Tambin puede pasar por nuestra oficina durante el horario de atencin regular y Charity fundraiser una tarjeta de cupones de GoodRx.  - Si necesita que su  receta se enve electrnicamente a una farmacia diferente, informe a nuestra oficina a travs de MyChart de [place] o por telfono llamando al [phone] y presione la opcin 4.

## 2022-02-21 DIAGNOSIS — Z0000 Encounter for general adult medical examination without abnormal findings: Secondary | ICD-10-CM | POA: Diagnosis not present

## 2022-02-21 DIAGNOSIS — E785 Hyperlipidemia, unspecified: Secondary | ICD-10-CM | POA: Diagnosis not present

## 2022-02-21 DIAGNOSIS — Z125 Encounter for screening for malignant neoplasm of prostate: Secondary | ICD-10-CM | POA: Diagnosis not present

## 2022-02-21 DIAGNOSIS — Z5181 Encounter for therapeutic drug level monitoring: Secondary | ICD-10-CM | POA: Diagnosis not present

## 2022-02-22 ENCOUNTER — Encounter: Payer: Self-pay | Admitting: Dermatology

## 2022-02-22 ENCOUNTER — Telehealth: Payer: Self-pay

## 2022-02-22 NOTE — Telephone Encounter (Signed)
Patient's lab work came in from [facility]. Dr. Nehemiah Massed reviewed. Scanned into labs.   Left voicemail advising patient labwork was received and OK to call if he needs anything else. aw

## 2022-02-23 ENCOUNTER — Telehealth: Payer: Self-pay

## 2022-02-23 NOTE — Telephone Encounter (Signed)
Advised patient that Dr Nehemiah Massed has reviewed his most recent labs and all are ok in regards to his Rinvoq so he is fine to continue taking as prescribed. Also advised him that his PSA is elevated and he should follow up with his PCP regarding that test results.  He did want Dr. Nehemiah Massed to know that right before his rash broke out he increased his Omeprazole from 20 mg to 40 mg. He just wondered if that could've been the cause of his rash.

## 2022-02-23 NOTE — Telephone Encounter (Signed)
Advised patient that the increased dose of Omeprazole could have caused his ras - if he is still taking, he could try to stop and see if he improves overall. He will discuss it with his PCP at his next visit.

## 2022-03-15 ENCOUNTER — Telehealth: Payer: Self-pay

## 2022-03-15 NOTE — Telephone Encounter (Signed)
Pt called he need a new PA for Rinvoq, discussed with pt we will start the PA for Rinvoq today

## 2022-03-15 NOTE — Telephone Encounter (Signed)
Pt called requesting a refill of Rinvoq

## 2022-03-16 ENCOUNTER — Other Ambulatory Visit: Payer: Self-pay

## 2022-03-16 DIAGNOSIS — L2081 Atopic neurodermatitis: Secondary | ICD-10-CM

## 2022-03-16 MED ORDER — RINVOQ 15 MG PO TB24: 1.0000 | ORAL_TABLET | Freq: Every day | ORAL | 3 refills | Status: DC

## 2022-03-16 NOTE — Progress Notes (Signed)
New PA needed for Rinvoq. Escripted to [facility] for new PA

## 2022-03-20 ENCOUNTER — Other Ambulatory Visit: Payer: Self-pay | Admitting: Dermatology

## 2022-03-20 DIAGNOSIS — L2081 Atopic neurodermatitis: Secondary | ICD-10-CM

## 2022-03-24 ENCOUNTER — Other Ambulatory Visit: Payer: Self-pay | Admitting: Dermatology

## 2022-03-24 DIAGNOSIS — L2081 Atopic neurodermatitis: Secondary | ICD-10-CM

## 2022-03-29 DIAGNOSIS — Z0189 Encounter for other specified special examinations: Secondary | ICD-10-CM | POA: Diagnosis not present

## 2022-03-30 DIAGNOSIS — Z7189 Other specified counseling: Secondary | ICD-10-CM | POA: Diagnosis not present

## 2022-03-31 ENCOUNTER — Other Ambulatory Visit: Payer: Self-pay

## 2022-03-31 DIAGNOSIS — N400 Enlarged prostate without lower urinary tract symptoms: Secondary | ICD-10-CM | POA: Diagnosis not present

## 2022-03-31 DIAGNOSIS — L2081 Atopic neurodermatitis: Secondary | ICD-10-CM

## 2022-03-31 DIAGNOSIS — R972 Elevated prostate specific antigen [PSA]: Secondary | ICD-10-CM | POA: Diagnosis not present

## 2022-03-31 MED ORDER — RINVOQ 15 MG PO TB24: 1.0000 | ORAL_TABLET | Freq: Every day | ORAL | 3 refills | Status: DC

## 2022-03-31 NOTE — Progress Notes (Signed)
[facility] faxed over pharmacy transfer to [facility]. Escripted

## 2022-05-18 ENCOUNTER — Ambulatory Visit: Payer: BC Managed Care – PPO | Admitting: Dermatology

## 2022-05-18 DIAGNOSIS — Z79899 Other long term (current) drug therapy: Secondary | ICD-10-CM

## 2022-05-18 DIAGNOSIS — L2081 Atopic neurodermatitis: Secondary | ICD-10-CM

## 2022-05-18 NOTE — Patient Instructions (Signed)
Due to recent changes in healthcare laws, you may see results of your pathology and/or laboratory studies on MyChart before the doctors have had a chance to review them. We understand that in some cases there may be results that are confusing or concerning to you. Please understand that not all results are received at the same time and often the doctors may need to interpret multiple results in order to provide you with the best plan of care or course of treatment. Therefore, we ask that you please give us 2 business days to thoroughly review all your results before contacting the office for clarification. Should we see a critical lab result, you will be contacted sooner.   If You Need Anything After Your Visit  If you have any questions or concerns for your doctor, please call our main line at [phone] and press option 4 to reach your doctor's medical assistant. If no one answers, please leave a voicemail as directed and we will return your call as soon as possible. Messages left after 4 pm will be answered the following business day.   You may also send us a message via MyChart. We typically respond to MyChart messages within 1-2 business days.  For prescription refills, please ask your pharmacy to contact our office. Our fax number is [phone].  If you have an urgent issue when the clinic is closed that cannot wait until the next business day, you can page your doctor at the number below.    Please note that while we do our best to be available for urgent issues outside of office hours, we are not available 24/7.   If you have an urgent issue and are unable to reach us, you may choose to seek medical care at your doctor's office, retail clinic, urgent care center, or emergency room.  If you have a medical emergency, please immediately call 911 or go to the emergency department.  Pager Numbers  - Dr. Kowalski: [phone]  - Dr. Moye: [phone]  - Dr. Stewart:  [phone]  In the event of inclement weather, please call our main line at [phone] for an update on the status of any delays or closures.  Dermatology Medication Tips: Please keep the boxes that topical medications come in in order to help keep track of the instructions about where and how to use these. Pharmacies typically print the medication instructions only on the boxes and not directly on the medication tubes.   If your medication is too expensive, please contact our office at [phone] option 4 or send us a message through MyChart.   We are unable to tell what your co-pay for medications will be in advance as this is different depending on your insurance coverage. However, we may be able to find a substitute medication at lower cost or fill out paperwork to get insurance to cover a needed medication.   If a prior authorization is required to get your medication covered by your insurance company, please allow us 1-2 business days to complete this process.  Drug prices often vary depending on where the prescription is filled and some pharmacies may offer cheaper prices.  The website www.goodrx.com contains coupons for medications through different pharmacies. The prices here do not account for what the cost may be with help from insurance (it may be cheaper with your insurance), but the website can give you the price if you did not use any insurance.  - You can print the associated coupon and take it with   your prescription to the pharmacy.  - You may also stop by our office during regular business hours and pick up a GoodRx coupon card.  - If you need your prescription sent electronically to a different pharmacy, notify our office through [place] MyChart or by phone at [phone] option 4.     Si Usted Necesita Algo Despus de Su Visita  Tambin puede enviarnos un mensaje a travs de MyChart. Por lo general respondemos a los mensajes de MyChart en el transcurso de 1 a 2  das hbiles.  Para renovar recetas, por favor pida a su farmacia que se ponga en contacto con nuestra oficina. Nuestro nmero de fax es el [phone].  Si tiene un asunto urgente cuando la clnica est cerrada y que no puede esperar hasta el siguiente da hbil, puede llamar/localizar a su doctor(a) al nmero que aparece a continuacin.   Por favor, tenga en cuenta que aunque hacemos todo lo posible para estar disponibles para asuntos urgentes fuera del horario de oficina, no estamos disponibles las 24 horas del da, los 7 das de la semana.   Si tiene un problema urgente y no puede comunicarse con nosotros, puede optar por buscar atencin mdica  en el consultorio de su doctor(a), en una clnica privada, en un centro de atencin urgente o en una sala de emergencias.  Si tiene una emergencia mdica, por favor llame inmediatamente al 911 o vaya a la sala de emergencias.  Nmeros de bper  - Dr. Kowalski: [phone]  - Dra. Moye: [phone]  - Dra. Stewart: [phone]  En caso de inclemencias del tiempo, por favor llame a nuestra lnea principal al [phone] para una actualizacin sobre el estado de cualquier retraso o cierre.  Consejos para la medicacin en dermatologa: Por favor, guarde las cajas en las que vienen los medicamentos de uso tpico para ayudarle a seguir las instrucciones sobre dnde y cmo usarlos. Las farmacias generalmente imprimen las instrucciones del medicamento slo en las cajas y no directamente en los tubos del medicamento.   Si su medicamento es muy caro, por favor, pngase en contacto con nuestra oficina llamando al [phone] y presione la opcin 4 o envenos un mensaje a travs de MyChart.   No podemos decirle cul ser su copago por los medicamentos por adelantado ya que esto es diferente dependiendo de la cobertura de su seguro. Sin embargo, es posible que podamos encontrar un medicamento sustituto a menor costo o llenar un formulario para que el  seguro cubra el medicamento que se considera necesario.   Si se requiere una autorizacin previa para que su compaa de seguros cubra su medicamento, por favor permtanos de 1 a 2 das hbiles para completar este proceso.  Los precios de los medicamentos varan con frecuencia dependiendo del lugar de dnde se surte la receta y alguna farmacias pueden ofrecer precios ms baratos.  El sitio web www.goodrx.com tiene cupones para medicamentos de diferentes farmacias. Los precios aqu no tienen en cuenta lo que podra costar con la ayuda del seguro (puede ser ms barato con su seguro), pero el sitio web puede darle el precio si no utiliz ningn seguro.  - Puede imprimir el cupn correspondiente y llevarlo con su receta a la farmacia.  - Tambin puede pasar por nuestra oficina durante el horario de atencin regular y recoger una tarjeta de cupones de GoodRx.  - Si necesita que su receta se enve electrnicamente a una farmacia diferente, informe a nuestra oficina a travs de MyChart de    o por telfono llamando al [phone] y presione la opcin 4.  

## 2022-05-18 NOTE — Progress Notes (Signed)
   Follow-Up Visit   Subjective  Trevor Walker is a 56 y.o. male who presents for the following: Atopic neurodermatitis (Had the flu towards the end of September so he stopped it for a few days. Then he took a trip to [place] was in his luggage and they lost his luggage. He also c/o GI upsets and thinks it may be related to dairy consumption. Altogether he has been off of Rinvoq for about 3 weeks. Pt has had an increase in itch and some difficulty sleeping).  The following portions of the chart were reviewed this encounter and updated as appropriate:   Tobacco  Allergies  Meds  Problems  Med Hx  Surg Hx  Fam Hx     Review of Systems:  No other skin or systemic complaints except as noted in HPI or Assessment and Plan.  Objective  Well appearing patient in no apparent distress; mood and affect are within normal limits.  A focused examination was performed including the face, trunk, and extremities. Relevant physical exam findings are noted in the Assessment and Plan.  Trunk, extremities Lichenification of the buttocks. Some excoriations on the post thigh.    Assessment & Plan  Atopic neurodermatitis Trunk, extremities  Severe, chronic, and persistent condition with duration or expected duration over one year. Condition is bothersome/symptomatic for patient.  Flared after being off Rinvoq while sick.  Bx proven "spongiotic dermatitis".  Patch Test x36 "true test" is negative after 1 week. He has failed Dupixent course for over 1 year. Atopic dermatitis (eczema) is a chronic, relapsing, pruritic condition that can significantly affect quality of life. It is often associated with allergic rhinitis and/or asthma and can require treatment with topical medications, phototherapy, or in severe cases biologic injectable medication (Dupixent; Adbry) or Oral JAK inhibitors.    TB test from 11/02/21 negative Negative True Patch Test 36 Discussed expanded patch testing at  [facility], but not likely productive at this time.  Continue Rinvoq '15mg'$  po QD. May increase Rinvoq to 30 mg daily if needed if not controlled on 15 mg daily  Continue Eucrisa 2% oint QD , Mometasone cream qd up to 5 times per week. Topical steroids (such as triamcinolone, fluocinolone, fluocinonide, mometasone, clobetasol, halobetasol, betamethasone, hydrocortisone) can cause thinning and lightening of the skin if they are used for too long in the same area. Your physician has selected the right strength medicine for your problem and area affected on the body. Please use your medication only as directed by your physician to prevent side effects.    Ok to continue Rinvoq during flu vaccine so long as its not a live vaccine. If live vaccine recommend stopping Rinvoq for a few weeks.   CBC with Differential/Platelets - Trunk, extremities CMP - Trunk, extremities Lipid Panel - Trunk, extremities  Related Medications Crisaborole (EUCRISA) 2 % OINT Apply 1 Application topically daily.  Upadacitinib ER (RINVOQ) 15 MG TB24 Take 1 tablet by mouth daily. 1 po qd  Return in about 6 months (around 11/17/2022) for atopic neurodermatitis follow up .  [name], CMA, am acting as scribe for [name], MD . Documentation: I have reviewed the above documentation for accuracy and completeness, and I agree with the above.  [name], MD

## 2022-05-28 ENCOUNTER — Encounter: Payer: Self-pay | Admitting: Dermatology

## 2022-06-15 ENCOUNTER — Other Ambulatory Visit: Payer: Self-pay

## 2022-06-15 DIAGNOSIS — L308 Other specified dermatitis: Secondary | ICD-10-CM

## 2022-06-15 MED ORDER — MUPIROCIN 2 % EX OINT: TOPICAL_OINTMENT | CUTANEOUS | 0 refills | Status: DC

## 2022-08-09 ENCOUNTER — Other Ambulatory Visit: Payer: Self-pay | Admitting: Dermatology

## 2022-08-09 DIAGNOSIS — L2081 Atopic neurodermatitis: Secondary | ICD-10-CM

## 2022-08-17 DIAGNOSIS — K5730 Diverticulosis of large intestine without perforation or abscess without bleeding: Secondary | ICD-10-CM | POA: Diagnosis not present

## 2022-08-17 DIAGNOSIS — K640 First degree hemorrhoids: Secondary | ICD-10-CM | POA: Diagnosis not present

## 2022-08-17 DIAGNOSIS — K644 Residual hemorrhoidal skin tags: Secondary | ICD-10-CM | POA: Diagnosis not present

## 2022-08-17 DIAGNOSIS — Z86010 Personal history of colonic polyps: Secondary | ICD-10-CM | POA: Diagnosis not present

## 2022-09-19 ENCOUNTER — Ambulatory Visit: Payer: BC Managed Care – PPO | Admitting: Dermatology

## 2022-10-03 ENCOUNTER — Ambulatory Visit: Payer: BC Managed Care – PPO | Admitting: Dermatology

## 2022-10-03 VITALS — BP 136/84

## 2022-10-03 DIAGNOSIS — L2081 Atopic neurodermatitis: Secondary | ICD-10-CM | POA: Diagnosis not present

## 2022-10-03 DIAGNOSIS — Z79899 Other long term (current) drug therapy: Secondary | ICD-10-CM

## 2022-10-03 MED ORDER — ZOSTER VAC RECOMB ADJUVANTED 50 MCG/0.5ML IM SUSR: 0.5000 mL | Freq: Once | INTRAMUSCULAR | 1 refills | Status: AC

## 2022-10-03 NOTE — Patient Instructions (Signed)
Due to recent changes in healthcare laws, you may see results of your pathology and/or laboratory studies on MyChart before the doctors have had a chance to review them. We understand that in some cases there may be results that are confusing or concerning to you. Please understand that not all results are received at the same time and often the doctors may need to interpret multiple results in order to provide you with the best plan of care or course of treatment. Therefore, we ask that you please give us 2 business days to thoroughly review all your results before contacting the office for clarification. Should we see a critical lab result, you will be contacted sooner.   If You Need Anything After Your Visit  If you have any questions or concerns for your doctor, please call our main line at [phone] and press option 4 to reach your doctor's medical assistant. If no one answers, please leave a voicemail as directed and we will return your call as soon as possible. Messages left after 4 pm will be answered the following business day.   You may also send us a message via MyChart. We typically respond to MyChart messages within 1-2 business days.  For prescription refills, please ask your pharmacy to contact our office. Our fax number is [phone].  If you have an urgent issue when the clinic is closed that cannot wait until the next business day, you can page your doctor at the number below.    Please note that while we do our best to be available for urgent issues outside of office hours, we are not available 24/7.   If you have an urgent issue and are unable to reach us, you may choose to seek medical care at your doctor's office, retail clinic, urgent care center, or emergency room.  If you have a medical emergency, please immediately call 911 or go to the emergency department.  Pager Numbers  - Dr. Kowalski: [phone]  - Dr. Moye: [phone]  - Dr. Stewart:  [phone]  In the event of inclement weather, please call our main line at [phone] for an update on the status of any delays or closures.  Dermatology Medication Tips: Please keep the boxes that topical medications come in in order to help keep track of the instructions about where and how to use these. Pharmacies typically print the medication instructions only on the boxes and not directly on the medication tubes.   If your medication is too expensive, please contact our office at [phone] option 4 or send us a message through MyChart.   We are unable to tell what your co-pay for medications will be in advance as this is different depending on your insurance coverage. However, we may be able to find a substitute medication at lower cost or fill out paperwork to get insurance to cover a needed medication.   If a prior authorization is required to get your medication covered by your insurance company, please allow us 1-2 business days to complete this process.  Drug prices often vary depending on where the prescription is filled and some pharmacies may offer cheaper prices.  The website www.goodrx.com contains coupons for medications through different pharmacies. The prices here do not account for what the cost may be with help from insurance (it may be cheaper with your insurance), but the website can give you the price if you did not use any insurance.  - You can print the associated coupon and take it with   your prescription to the pharmacy.  - You may also stop by our office during regular business hours and pick up a GoodRx coupon card.  - If you need your prescription sent electronically to a different pharmacy, notify our office through [facility] MyChart or by phone at [phone] option 4.     Si Usted Necesita Algo Despus de Su Visita  Tambin puede enviarnos un mensaje a travs de MyChart. Por lo general respondemos a los mensajes de MyChart en el transcurso de 1 a 2  das hbiles.  Para renovar recetas, por favor pida a su farmacia que se ponga en contacto con nuestra oficina. Nuestro nmero de fax es el [phone].  Si tiene un asunto urgente cuando la clnica est cerrada y que no puede esperar hasta el siguiente da hbil, puede llamar/localizar a su doctor(a) al nmero que aparece a continuacin.   Por favor, tenga en cuenta que aunque hacemos todo lo posible para estar disponibles para asuntos urgentes fuera del horario de oficina, no estamos disponibles las 24 horas del da, los 7 das de la semana.   Si tiene un problema urgente y no puede comunicarse con nosotros, puede optar por buscar atencin mdica  en el consultorio de su doctor(a), en una clnica privada, en un centro de atencin urgente o en una sala de emergencias.  Si tiene una emergencia mdica, por favor llame inmediatamente al 911 o vaya a la sala de emergencias.  Nmeros de bper  - Dr. Kowalski: [phone]  - Dra. Moye: [phone]  - Dra. Stewart: [phone]  En caso de inclemencias del tiempo, por favor llame a nuestra lnea principal al [phone] para una actualizacin sobre el estado de cualquier retraso o cierre.  Consejos para la medicacin en dermatologa: Por favor, guarde las cajas en las que vienen los medicamentos de uso tpico para ayudarle a seguir las instrucciones sobre dnde y cmo usarlos. Las farmacias generalmente imprimen las instrucciones del medicamento slo en las cajas y no directamente en los tubos del medicamento.   Si su medicamento es muy caro, por favor, pngase en contacto con nuestra oficina llamando al [phone] y presione la opcin 4 o envenos un mensaje a travs de MyChart.   No podemos decirle cul ser su copago por los medicamentos por adelantado ya que esto es diferente dependiendo de la cobertura de su seguro. Sin embargo, es posible que podamos encontrar un medicamento sustituto a menor costo o llenar un formulario para que el  seguro cubra el medicamento que se considera necesario.   Si se requiere una autorizacin previa para que su compaa de seguros cubra su medicamento, por favor permtanos de 1 a 2 das hbiles para completar este proceso.  Los precios de los medicamentos varan con frecuencia dependiendo del lugar de dnde se surte la receta y alguna farmacias pueden ofrecer precios ms baratos.  El sitio web www.goodrx.com tiene cupones para medicamentos de diferentes farmacias. Los precios aqu no tienen en cuenta lo que podra costar con la ayuda del seguro (puede ser ms barato con su seguro), pero el sitio web puede darle el precio si no utiliz ningn seguro.  - Puede imprimir el cupn correspondiente y llevarlo con su receta a la farmacia.  - Tambin puede pasar por nuestra oficina durante el horario de atencin regular y recoger una tarjeta de cupones de GoodRx.  - Si necesita que su receta se enve electrnicamente a una farmacia diferente, informe a nuestra oficina a travs de MyChart de [place]   o por telfono llamando al [phone] y presione la opcin 4.  

## 2022-10-03 NOTE — Progress Notes (Signed)
   Follow-Up Visit   Subjective  Trevor Walker is a 57 y.o. male who presents for the following: Follow-up (Atopic neurodermatitis 4 month follow up - Rinvoq 150 mg 1 po qd - he has been much better with less itching since starting Rinvoq but still has some flares.). He feels Rinvoq is doing better than the [place].  The following portions of the chart were reviewed this encounter and updated as appropriate:   Tobacco  Allergies  Meds  Problems  Med Hx  Surg Hx  Fam Hx     Review of Systems:  No other skin or systemic complaints except as noted in HPI or Assessment and Plan.  Objective  Well appearing patient in no apparent distress; mood and affect are within normal limits.  A focused examination was performed including arms, legs, buttocks. Relevant physical exam findings are noted in the Assessment and Plan.  Mild patches of elbows                  Assessment & Plan  Atopic neurodermatitis with excoriations; lichenification; and dyspigmentation See photos Chronic and persistent condition with duration or expected duration over one year. Condition is bothersome/symptomatic for patient. Currently flared. Overall he is improved he has had a recent flare.  Atopic dermatitis (eczema) is a chronic, relapsing, pruritic condition that can significantly affect quality of life. It is often associated with allergic rhinitis and/or asthma and can require treatment with topical medications, phototherapy, or in severe cases biologic injectable medication (Dupixent; Adbry) or Oral JAK inhibitors.  Recommend he get an updated Shingrix vaccine.  2 weeks after his vaccine,  we will plan to increase Rinvoq 15 mg to 2 po qd.  Zoster Vaccine Adjuvanted [facility]) injection Inject 0.5 mLs into the muscle once for 1 dose.  Related Medications Crisaborole (EUCRISA) 2 % OINT Apply 1 Application topically daily.  RINVOQ 15 MG TB24 TAKE 1 TABLET DAILY  Return in about 3  months (around 01/01/2023) for Atopic Dermatitis.  I, Ashok Cordia, CMA, am acting as scribe for [name], MD . Documentation: I have reviewed the above documentation for accuracy and completeness, and I agree with the above.  [name], MD

## 2022-10-11 ENCOUNTER — Encounter: Payer: Self-pay | Admitting: Dermatology

## 2022-12-13 ENCOUNTER — Other Ambulatory Visit: Payer: Self-pay | Admitting: Dermatology

## 2022-12-13 DIAGNOSIS — L2081 Atopic neurodermatitis: Secondary | ICD-10-CM

## 2022-12-29 ENCOUNTER — Ambulatory Visit: Payer: BC Managed Care – PPO | Admitting: Dermatology

## 2022-12-29 ENCOUNTER — Encounter: Payer: Self-pay | Admitting: Dermatology

## 2022-12-29 VITALS — BP 148/99 | HR 63

## 2022-12-29 DIAGNOSIS — L2081 Atopic neurodermatitis: Secondary | ICD-10-CM | POA: Diagnosis not present

## 2022-12-29 DIAGNOSIS — L299 Pruritus, unspecified: Secondary | ICD-10-CM

## 2022-12-29 DIAGNOSIS — Z79899 Other long term (current) drug therapy: Secondary | ICD-10-CM

## 2022-12-29 DIAGNOSIS — Z7189 Other specified counseling: Secondary | ICD-10-CM

## 2022-12-29 DIAGNOSIS — T07XXXA Unspecified multiple injuries, initial encounter: Secondary | ICD-10-CM

## 2022-12-29 DIAGNOSIS — L209 Atopic dermatitis, unspecified: Secondary | ICD-10-CM

## 2022-12-29 DIAGNOSIS — L819 Disorder of pigmentation, unspecified: Secondary | ICD-10-CM

## 2022-12-29 NOTE — Patient Instructions (Addendum)
Recommend Rinvoq 15 mg by mouth twice a day can take every 12 hours   Continue Eucrisa ointment - to affected areas daily     Due to recent changes in healthcare laws, you may see results of your pathology and/or laboratory studies on MyChart before the doctors have had a chance to review them. We understand that in some cases there may be results that are confusing or concerning to you. Please understand that not all results are received at the same time and often the doctors may need to interpret multiple results in order to provide you with the best plan of care or course of treatment. Therefore, we ask that you please give [place] 2 business days to thoroughly review all your results before contacting the office for clarification. Should we see a critical lab result, you will be contacted sooner.   If You Need Anything After Your Visit  If you have any questions or concerns for your doctor, please call our main line at [phone] and press option 4 to reach your doctor's medical assistant. If no one answers, please leave a voicemail as directed and we will return your call as soon as possible. Messages left after 4 pm will be answered the following business day.   You may also send [place] a message via MyChart. We typically respond to MyChart messages within 1-2 business days.  For prescription refills, please ask your pharmacy to contact our office. Our fax number is [phone].  If you have an urgent issue when the clinic is closed that cannot wait until the next business day, you can page your doctor at the number below.    Please note that while we do our best to be available for urgent issues outside of office hours, we are not available 24/7.   If you have an urgent issue and are unable to reach [place], you may choose to seek medical care at your doctor's office, retail clinic, urgent care center, or emergency room.  If you have a medical emergency, please immediately call 911 or go to the  emergency department.  Pager Numbers  - Dr. Gwen Pounds: [phone]  - Dr. Neale Burly: [phone]  - Dr. Roseanne Reno: [phone]  In the event of inclement weather, please call our main line at [phone] for an update on the status of any delays or closures.  Dermatology Medication Tips: Please keep the boxes that topical medications come in in order to help keep track of the instructions about where and how to use these. Pharmacies typically print the medication instructions only on the boxes and not directly on the medication tubes.   If your medication is too expensive, please contact our office at [phone] option 4 or send [place] a message through MyChart.   We are unable to tell what your co-pay for medications will be in advance as this is different depending on your insurance coverage. However, we may be able to find a substitute medication at lower cost or fill out paperwork to get insurance to cover a needed medication.   If a prior authorization is required to get your medication covered by your insurance company, please allow [place] 1-2 business days to complete this process.  Drug prices often vary depending on where the prescription is filled and some pharmacies may offer cheaper prices.  The website www.goodrx.com contains coupons for medications through different pharmacies. The prices here do not account for what the cost may be with help from insurance (it may be cheaper with your  insurance), but the website can give you the price if you did not use any insurance.  - You can print the associated coupon and take it with your prescription to the pharmacy.  - You may also stop by our office during regular business hours and pick up a GoodRx coupon card.  - If you need your prescription sent electronically to a different pharmacy, notify our office through [facility] or by phone at [phone] option 4.     Si Usted Necesita Algo Despus de Su Visita  Tambin puede  enviarnos un mensaje a travs de Clinical cytogeneticist. Por lo general respondemos a los mensajes de MyChart en el transcurso de 1 a 2 das hbiles.  Para renovar recetas, por favor pida a su farmacia que se ponga en contacto con nuestra oficina. Annie Sable de fax es [place] [phone].  Si tiene un asunto urgente cuando la clnica est cerrada y que no puede esperar hasta el siguiente da hbil, puede llamar/localizar a su doctor(a) al nmero que aparece a continuacin.   Por favor, tenga en cuenta que aunque hacemos todo lo posible para estar disponibles para asuntos urgentes fuera del horario de [place], no estamos disponibles las 24 horas del da, los 7 [address]  [address] de la [place].   Si tiene un problema urgente y no puede comunicarse con nosotros, puede optar por buscar atencin mdica  en el consultorio de su doctor(a), en una clnica privada, en un centro de atencin urgente o en una sala de emergencias.  Si tiene Engineer, drilling, por favor llame inmediatamente al 911 o vaya a la sala de emergencias.  Nmeros de bper  - Dr. Gwen Pounds: [phone]  - Dra. Moye: [phone]  - Dra. Roseanne Reno: [phone]  En caso de inclemencias del [place], por favor llame a Lacy Duverney principal al [phone] para una actualizacin sobre el [place] de cualquier retraso o cierre.  Consejos para la medicacin en dermatologa: Por favor, guarde las cajas en las que vienen los medicamentos de uso tpico para ayudarle a seguir las instrucciones sobre dnde y cmo usarlos. Las farmacias generalmente imprimen las instrucciones del medicamento slo en las cajas y no directamente en los tubos del [place].   Si su medicamento es muy caro, por favor, pngase en contacto con Rolm Gala llamando al [phone] y presione la opcin 4 o envenos un mensaje a travs de Clinical cytogeneticist.   No podemos decirle cul ser su copago por los medicamentos por adelantado ya que esto es diferente dependiendo de la cobertura de su seguro.  Sin embargo, es posible que podamos encontrar un medicamento sustituto a Audiological scientist un formulario para que el seguro cubra el medicamento que se considera necesario.   Si se requiere una autorizacin previa para que su compaa de seguros [place] su medicamento, por favor permtanos de 1 a 2 das hbiles para completar [address]  Los precios de los medicamentos varan con frecuencia dependiendo del Environmental consultant de dnde se surte la receta y alguna farmacias pueden ofrecer precios ms baratos.  El sitio web www.goodrx.com tiene cupones para medicamentos de Health and safety inspector. Los precios aqu no tienen en cuenta lo que podra costar con la ayuda del seguro (puede ser ms barato con su seguro), pero el sitio web puede darle el precio si no utiliz Tourist information centre manager.  - Puede imprimir el cupn correspondiente y llevarlo con su receta a la farmacia.  - Tambin puede pasar por nuestra oficina durante el horario de atencin regular y Education officer, museum una tarjeta de  cupones de GoodRx.  - Si necesita que su receta se enve electrnicamente a una farmacia diferente, informe a nuestra oficina a travs de MyChart de [place] o por telfono llamando al [phone] y presione la opcin 4.

## 2022-12-29 NOTE — Progress Notes (Signed)
   Follow-Up Visit   Subjective  Trevor Walker is a 57 y.o. male who presents for the following:  3 month Atopic Dermatitis follow up, patient reports is seeing improvement at elbows and buttocks while on rinvoq and eucrisa. Has not increase dosage yet. Wanted to discuss. Still having some flare at back of legs.   The following portions of the chart were reviewed this encounter and updated as appropriate: medications, allergies, medical history  Review of Systems:  No other skin or systemic complaints except as noted in HPI or Assessment and Plan.  Objective  Well appearing patient in no apparent distress; mood and affect are within normal limits. Areas Examined: Arms, legs, buttock Relevant physical exam findings are noted in the Assessment and Plan.   Assessment & Plan   Atopic neurodermatitis  Related Medications Crisaborole (EUCRISA) 2 % OINT Apply 1 Application topically daily.  Upadacitinib ER (RINVOQ) 15 MG TB24 Take 2 tabs po QD.   ATOPIC DERMATITIS Exam: Few excoriations around elbows, some crusty and spotty hyperpigmentation on anterior and posterior thigh.  Improved at elbows and buttocks but significant persistence. Chronic and persistent condition with duration or expected duration over one year. Condition is symptomatic/ bothersome to patient. Not currently at goal. But improving.   Compared with previous photos  Patient followed up with his pcp concerning Shingrix vaccine. Primary states patient had the newest version of Shingrix in 2020. Did not need at this time.   Atopic dermatitis (eczema) is a chronic, relapsing, pruritic condition that can significantly affect quality of life. It is often associated with allergic rhinitis and/or asthma and can require treatment with topical medications, phototherapy, or in severe cases biologic injectable medication (Dupixent; Adbry) or Oral JAK inhibitors.  Treatment Plan: Recommend starting Rinvoq 15 mg - take 1 tabs  by mouth twice daily.  If not covered by insurance, may have to take 30 mg once daily.  Continue Eucrisa Ointment as directed.    Sample of Rinvoq  given today  Lot [id number] Exp 09/03/2024  Will reach out to [facility] next week about PA   Return in about 4 months (around 05/01/2023) for atopic derm .  IAsher Muir, CMA, am acting as scribe for Armida Sans, MD.  Documentation: I have reviewed the above documentation for accuracy and completeness, and I agree with the above.  Armida Sans, MD

## 2023-01-05 ENCOUNTER — Telehealth: Payer: Self-pay

## 2023-01-05 MED ORDER — RINVOQ 30 MG PO TB24: 30.0000 mg | ORAL_TABLET | Freq: Every day | ORAL | 2 refills | Status: DC

## 2023-01-05 NOTE — Telephone Encounter (Signed)
Patient's insurance is not covering the 15mg  quantity of 60 pills because they have a Rinvoq 30mg .   Is patient moving up to 30mg  every day or only temporary?  If patient will be taking 30mg  daily we need to resend in RX for approved coverage. aw

## 2023-01-05 NOTE — Telephone Encounter (Signed)
Patient advised of information per Dr. Kowalski and RX sent in. aw 

## 2023-01-05 NOTE — Telephone Encounter (Signed)
Rinvoq 30mg  PA submitted ID: [id number]

## 2023-01-11 ENCOUNTER — Encounter: Payer: Self-pay | Admitting: Dermatology

## 2023-02-24 DIAGNOSIS — Z0000 Encounter for general adult medical examination without abnormal findings: Secondary | ICD-10-CM | POA: Diagnosis not present

## 2023-02-24 DIAGNOSIS — Z125 Encounter for screening for malignant neoplasm of prostate: Secondary | ICD-10-CM | POA: Diagnosis not present

## 2023-02-24 DIAGNOSIS — E7800 Pure hypercholesterolemia, unspecified: Secondary | ICD-10-CM | POA: Diagnosis not present

## 2023-02-24 DIAGNOSIS — Z5181 Encounter for therapeutic drug level monitoring: Secondary | ICD-10-CM | POA: Diagnosis not present

## 2023-02-24 DIAGNOSIS — E785 Hyperlipidemia, unspecified: Secondary | ICD-10-CM | POA: Diagnosis not present

## 2023-02-24 DIAGNOSIS — Z8249 Family history of ischemic heart disease and other diseases of the circulatory system: Secondary | ICD-10-CM | POA: Diagnosis not present

## 2023-03-21 ENCOUNTER — Other Ambulatory Visit: Payer: Self-pay | Admitting: Dermatology

## 2023-04-20 ENCOUNTER — Ambulatory Visit: Payer: BC Managed Care – PPO | Admitting: Dermatology

## 2023-04-20 DIAGNOSIS — Z7189 Other specified counseling: Secondary | ICD-10-CM

## 2023-04-20 DIAGNOSIS — Z79899 Other long term (current) drug therapy: Secondary | ICD-10-CM

## 2023-04-20 DIAGNOSIS — L209 Atopic dermatitis, unspecified: Secondary | ICD-10-CM | POA: Diagnosis not present

## 2023-04-20 DIAGNOSIS — L2089 Other atopic dermatitis: Secondary | ICD-10-CM

## 2023-04-20 DIAGNOSIS — L509 Urticaria, unspecified: Secondary | ICD-10-CM

## 2023-04-20 DIAGNOSIS — L299 Pruritus, unspecified: Secondary | ICD-10-CM

## 2023-04-20 NOTE — Progress Notes (Signed)
Follow-Up Visit   Subjective  Trevor Walker is a 57 y.o. male who presents for the following: Atopic Dermatitis - improved with Rinvoq 30 mg po QD, he is also using Eucrisa 2% ointment and Mometasone 0.1% cream occasionally, and OTC Eucerin lotion.  The following portions of the chart were reviewed this encounter and updated as appropriate: medications, allergies, medical history  Review of Systems:  No other skin or systemic complaints except as noted in HPI or Assessment and Plan.  Objective  Well appearing patient in no apparent distress; mood and affect are within normal limits.  Areas Examined: The face, arms, and legs  Relevant physical exam findings are noted in the Assessment and Plan.    Assessment & Plan   Encounter for long-term (current) use of medications  Related Procedures CBC with Differential/Platelets CMP   ATOPIC DERMATITIS with pruritus and urticaria with severe itch-  bx proven atopic dermatitis with features of psoriasis, but non-responsive to psoriasis treatment,  (09/23/20, DIF negative - Pathology and DIF not consistent with Dermatitis Herpetiformis - ruled out due to severe itch component),  patient has tried and failed Dupixent injection, Enstilar foam, Otezla 30 mg po BID, Calcipotriene foam, Calcipotriene solution, and Skyrizi injections  Exam: Excoriations and peeling on the elbows and legs and buttocks  20% BSA Chronic and persistent condition with duration or expected duration over one year. Condition is improving with treatment but not currently at goal.   Atopic dermatitis (eczema) is a chronic, relapsing, pruritic condition that can significantly affect quality of life. It is often associated with allergic rhinitis and/or asthma and can require treatment with topical medications, phototherapy, or in severe cases biologic injectable medication (Dupixent; Adbry) or Oral JAK inhibitors.  Urticaria or hives is a pink to red patchy whelp-  like rash of the skin that typically itches and it is the result of histamine release in the skin.   Hives may have multiple causes including stress, medications, infections, and systemic illness.  Sometimes there is a family history of chronic urticaria.   "Physical urticarias" may be caused by pressure (dermatographism), heat, sun, cold, vibration.   Insect bites can cause "papular urticaria". It is often difficult to find the cause of generalized hives.  Statistically, 70% of the time a cause of generalized hives is not found.  Sometimes hives can spontaneously resolve. Other times hives can persist and when it does, and no cause is found, and it has been at least 6 weeks since started, it is called "chronic idiopathic urticaria". Antihistamines are the mainstay for treatment.  In severe cases Xolair injections may be used.  Treatment Plan: Start Allegra or Claritin 1 or 2 po QAM. For Hives (urticaria); dermatographism or Itch: Start non sedating antihistamine (either Allegra 180mg , or Claritin 10mg , or Zyrtec 10mg ) daily.  All these are non-prescription ("Over the Counter").   Start out with 1 pill a day.   After a week if not improving may increase to 2 pills a day.   After another week if not improving may increase to 3 pills a day.   After another week if still not improving may take up to 4 pills a day. Stay at highest dose that keeps condition controlled, but only up to 4 pills a day. Stay at the controlling dose for at least 2 weeks. Contact office if taking 4 pills of antihistamine a day for at least 2 weeks without control of condition as other options may be available.  Continue Rinvoq  30 mg po QD.  Continue Eucrisa 2% ointment to aa's QD-BID, and Mometasone 0.1% cream to aa's QD-BID PRN. Consider Zoryve 0.15% cream. May prescribe at follow up if Eucrisa not improving condition.  Recommend gentle skin care.  Return in about 4 months (around 08/20/2023).  Maylene Roes, CMA,  am acting as scribe for Armida Sans, MD .  Documentation: I have reviewed the above documentation for accuracy and completeness, and I agree with the above.  Armida Sans, MD

## 2023-04-20 NOTE — Patient Instructions (Addendum)
For Hives (urticaria); dermatographism or Itch: Start non sedating antihistamine (either Allegra 180mg , or Claritin 10mg , or Zyrtec 10mg ) daily.  All these are non-prescription ("Over the Counter").   Start out with 1 pill a day.   After a week if not improving may increase to 2 pills a day.   After another week if not improving may increase to 3 pills a day.   After another week if still not improving may take up to 4 pills a day. Stay at highest dose that keeps condition controlled, but only up to 4 pills a day. Stay at the controlling dose for at least 2 weeks. Contact office if taking 4 pills of antihistamine a day for at least 2 weeks without control of condition as other options may be available.  Due to recent changes in healthcare laws, you may see results of your pathology and/or laboratory studies on MyChart before the doctors have had a chance to review them. We understand that in some cases there may be results that are confusing or concerning to you. Please understand that not all results are received at the same time and often the doctors may need to interpret multiple results in order to provide you with the best plan of care or course of treatment. Therefore, we ask that you please give [place] 2 business days to thoroughly review all your results before contacting the office for clarification. Should we see a critical lab result, you will be contacted sooner.   If You Need Anything After Your Visit  If you have any questions or concerns for your doctor, please call our main line at [phone] and press option 4 to reach your doctor's medical assistant. If no one answers, please leave a voicemail as directed and we will return your call as soon as possible. Messages left after 4 pm will be answered the following business day.   You may also send [place] a message via MyChart. We typically respond to MyChart messages within 1-2 business days.  For prescription refills, please ask your  pharmacy to contact our office. Our fax number is [phone].  If you have an urgent issue when the clinic is closed that cannot wait until the next business day, you can page your doctor at the number below.    Please note that while we do our best to be available for urgent issues outside of office hours, we are not available 24/7.   If you have an urgent issue and are unable to reach [place], you may choose to seek medical care at your doctor's office, retail clinic, urgent care center, or emergency room.  If you have a medical emergency, please immediately call 911 or go to the emergency department.  Pager Numbers  - Dr. Gwen Pounds: [phone]  - Dr. Roseanne Reno: [phone]  - Dr. Katrinka Blazing: [phone]   In the event of inclement weather, please call our main line at [phone] for an update on the status of any delays or closures.  Dermatology Medication Tips: Please keep the boxes that topical medications come in in order to help keep track of the instructions about where and how to use these. Pharmacies typically print the medication instructions only on the boxes and not directly on the medication tubes.   If your medication is too expensive, please contact our office at [phone] option 4 or send [place] a message through MyChart.   We are unable to tell what your co-pay for medications will be in advance as this is different  depending on your insurance coverage. However, we may be able to find a substitute medication at lower cost or fill out paperwork to get insurance to cover a needed medication.   If a prior authorization is required to get your medication covered by your insurance company, please allow [place] 1-2 business days to complete this process.  Drug prices often vary depending on where the prescription is filled and some pharmacies may offer cheaper prices.  The website www.goodrx.com contains coupons for medications through different pharmacies. The prices here do not  account for what the cost may be with help from insurance (it may be cheaper with your insurance), but the website can give you the price if you did not use any insurance.  - You can print the associated coupon and take it with your prescription to the pharmacy.  - You may also stop by our office during regular business hours and pick up a GoodRx coupon card.  - If you need your prescription sent electronically to a different pharmacy, notify our office through [facility] or by phone at [phone] option 4.     Si Usted Necesita Algo Despus de Su Visita  Tambin puede enviarnos un mensaje a travs de Clinical cytogeneticist. Por lo general respondemos a los mensajes de MyChart en el transcurso de 1 a 2 das hbiles.  Para renovar recetas, por favor pida a su farmacia que se ponga en contacto con nuestra oficina. Annie Sable de fax es [place] [phone].  Si tiene un asunto urgente cuando la clnica est cerrada y que no puede esperar hasta el siguiente da hbil, puede llamar/localizar a su doctor(a) al nmero que aparece a continuacin.   Por favor, tenga en cuenta que aunque hacemos todo lo posible para estar disponibles para asuntos urgentes fuera del horario de [place], no estamos disponibles las 24 horas del da, los 7 [address]  [address] de la [place].   Si tiene un problema urgente y no puede comunicarse con nosotros, puede optar por buscar atencin mdica  en el consultorio de su doctor(a), en una clnica privada, en un centro de atencin urgente o en una sala de emergencias.  Si tiene Engineer, drilling, por favor llame inmediatamente al 911 o vaya a la sala de emergencias.  Nmeros de bper  - Dr. Gwen Pounds: [phone]  - Dra. Roseanne Reno: [phone]  - Dr. Katrinka Blazing: [phone]   En caso de inclemencias del tiempo, por favor llame a Lacy Duverney principal al [phone] para una actualizacin sobre el [place] de cualquier retraso o cierre.  Consejos para la medicacin en dermatologa: Por  favor, guarde las cajas en las que vienen los medicamentos de uso tpico para ayudarle a seguir las instrucciones sobre dnde y cmo usarlos. Las farmacias generalmente imprimen las instrucciones del medicamento slo en las cajas y no directamente en los tubos del [place].   Si su medicamento es muy caro, por favor, pngase en contacto con Rolm Gala llamando al [phone] y presione la opcin 4 o envenos un mensaje a travs de Clinical cytogeneticist.   No podemos decirle cul ser su copago por los medicamentos por adelantado ya que esto es diferente dependiendo de la cobertura de su seguro. Sin embargo, es posible que podamos encontrar un medicamento sustituto a Audiological scientist un formulario para que el seguro cubra el medicamento que se considera necesario.   Si se requiere una autorizacin previa para que su compaa de seguros [place] su medicamento, por favor permtanos de 1 a 2 das hbiles para completar  este proceso.  Los precios de los medicamentos varan con frecuencia dependiendo del Environmental consultant de dnde se surte la receta y alguna farmacias pueden ofrecer precios ms baratos.  El sitio web www.goodrx.com tiene cupones para medicamentos de Health and safety inspector. Los precios aqu no tienen en cuenta lo que podra costar con la ayuda del seguro (puede ser ms barato con su seguro), pero el sitio web puede darle el precio si no utiliz Tourist information centre manager.  - Puede imprimir el cupn correspondiente y llevarlo con su receta a la farmacia.  - Tambin puede pasar por nuestra oficina durante el horario de atencin regular y Education officer, museum una tarjeta de cupones de GoodRx.  - Si necesita que su receta se enve electrnicamente a una farmacia diferente, informe a nuestra oficina a travs de MyChart de  o por telfono llamando al [phone] y presione la opcin 4.

## 2023-04-23 ENCOUNTER — Encounter: Payer: Self-pay | Admitting: Dermatology

## 2023-04-25 ENCOUNTER — Other Ambulatory Visit: Payer: Self-pay | Admitting: Dermatology

## 2023-05-04 ENCOUNTER — Ambulatory Visit: Payer: BC Managed Care – PPO | Admitting: Dermatology

## 2023-08-30 ENCOUNTER — Ambulatory Visit: Payer: BC Managed Care – PPO | Admitting: Dermatology

## 2023-09-20 ENCOUNTER — Ambulatory Visit: Payer: BC Managed Care – PPO | Admitting: Dermatology

## 2023-10-16 ENCOUNTER — Other Ambulatory Visit: Payer: Self-pay | Admitting: Dermatology

## 2023-10-31 ENCOUNTER — Ambulatory Visit: Payer: BC Managed Care – PPO | Admitting: Dermatology

## 2023-10-31 DIAGNOSIS — R21 Rash and other nonspecific skin eruption: Secondary | ICD-10-CM | POA: Diagnosis not present

## 2023-10-31 DIAGNOSIS — Z7189 Other specified counseling: Secondary | ICD-10-CM

## 2023-10-31 DIAGNOSIS — L209 Atopic dermatitis, unspecified: Secondary | ICD-10-CM

## 2023-10-31 DIAGNOSIS — Z79899 Other long term (current) drug therapy: Secondary | ICD-10-CM

## 2023-10-31 DIAGNOSIS — R911 Solitary pulmonary nodule: Secondary | ICD-10-CM | POA: Diagnosis not present

## 2023-10-31 MED ORDER — IVERMECTIN 3 MG PO TABS: ORAL_TABLET | ORAL | 0 refills | Status: DC

## 2023-10-31 NOTE — Progress Notes (Unsigned)
   Follow-Up Visit   Subjective  Trevor Walker is a 58 y.o. male who presents for the following: Atopic Dermatitis, biopsy proven  Patient currently taking Rinvoq 30 mg every day, non-sedating antihistamine occasionally and using Eucrisa when needed. Patient does have active areas at legs, buttocks and arms. Rinvoq seems to help with the itching at night. Nobody else at home with itching.    The following portions of the chart were reviewed this encounter and updated as appropriate: medications, allergies, medical history  Review of Systems:  No other skin or systemic complaints except as noted in HPI or Assessment and Plan.  Objective  Well appearing patient in no apparent distress; mood and affect are within normal limits.  Areas Examined: Arms, legs, buttocks  Relevant physical exam findings are noted in the Assessment and Plan.    Assessment & Plan   RASH AND OTHER NONSPECIFIC SKIN ERUPTION   Related Medications ivermectin (STROMECTOL) 3 MG TABS tablet Take 7 tablets once and then a second time 14 days later ATOPIC DERMATITIS, UNSPECIFIED TYPE   LONG-TERM USE OF HIGH-RISK MEDICATION   COUNSELING AND COORDINATION OF CARE     ATOPIC DERMATITIS, severe and recalcitrant to systemic therapy Exam: Scaly pink papules coalescing to plaques and indurated papulonodules on medial buttocks, popliteal fossae, arms legs, elbows 20% BSA  Patient has tried and failed Dupixent injection, Enstilar foam, Otezla 30 mg po BID, Calcipotriene foam, Calcipotriene solution, and Skyrizi injections   09/23/20  1. Skin , left forearm proximal SPONGIOTIC VESICULAR DERMATITIS, SEE DESCRIPTION 2. Direct Immunofluorescence, left forearm distal NEGATIVE FOR IMMUNOREACTANTS 3. Skin , left thigh - posterior SPONGIOTIC VESICULAR DERMATITIS, SEE DESCRIPTION  10/10/23 CBC diff no low counts, CMP normal except high Cr 1.5. no dose adjustment given GFR 54. Normal lipid panel   Chronic and  persistent condition with duration or expected duration over one year. Condition is bothersome/symptomatic for patient. Currently flared.   Atopic dermatitis (eczema) is a chronic, relapsing, pruritic condition that can significantly affect quality of life. It is often associated with allergic rhinitis and/or asthma and can require treatment with topical medications, phototherapy, or in severe cases biologic injectable medication (Dupixent; Adbry) or Oral JAK inhibitors.  Treatment Plan: Patient has had shingles vaccine, has hx of rosacea.  Continue Rinvoq 30 mg every day  Continue anti-histamine prn Continue Eucrisa prn   Recommend gentle skin care.  Patient will have blood work with PCP in about 3 months.  Given persistence of eczematous dermatitis despite maximal therapy, concern for occult conditions such as scabies. No one else at home has rash but this does not fully exclude scabies. Disucssed trial of scabies treatment, patient agrees.   Side effects of ivermectin include but are not limited to: Cardiovascular: Orthostatic hypotension (1.1% ), Peripheral edema (3.2% ), Tachycardia (3.5% ) Dermatologic: Edema of face (1.2% ), Pruritus (2.8% ), Urticaria (0.9% ) Musculoskeletal: Myalgia (0.4% ) Neurologic: Dizziness (2.8% ), Headache (0.2% )  Start ivermectin 3 mg, 7 tablets for one dose, repeat in 14 days. Send update on MyChart in 1 month   Return in about 6 months (around 05/02/2024) for AD.  Anise Salvo, RMA, am acting as scribe for Elie Goody, MD .   Documentation: I have reviewed the above documentation for accuracy and completeness, and I agree with the above.  Elie Goody, MD

## 2023-10-31 NOTE — Patient Instructions (Signed)

## 2023-11-01 ENCOUNTER — Encounter: Payer: Self-pay | Admitting: Dermatology

## 2023-11-08 ENCOUNTER — Telehealth: Payer: Self-pay

## 2023-11-08 NOTE — Telephone Encounter (Signed)
 Ivermectin 3 mg PA denied by patient's insurance (see media). I discussed this with patient and told him our office can file an appeal, but he would need to sign "appointment of representative" form faxed to our office by [employer]. We also discussed using GoodRx and getting the drug at [facility] for around $14.00 with their coupon. Patient hasn't decided whether or not he wants to pick up the medication, because he doesn't feel symptoms are consistent with scabies since rash has been on going for 5 years. Patient not interested in appeal at this time and he will contact our office if he needs anything.

## 2023-11-27 ENCOUNTER — Encounter: Payer: Self-pay | Admitting: Dermatology

## 2023-11-29 ENCOUNTER — Other Ambulatory Visit: Payer: Self-pay | Admitting: Dermatology

## 2023-11-29 DIAGNOSIS — R21 Rash and other nonspecific skin eruption: Secondary | ICD-10-CM

## 2023-11-29 MED ORDER — IVERMECTIN 3 MG PO TABS: ORAL_TABLET | ORAL | 0 refills | Status: DC

## 2024-01-02 ENCOUNTER — Encounter: Payer: Self-pay | Admitting: Dermatology

## 2024-01-16 DIAGNOSIS — Z043 Encounter for examination and observation following other accident: Secondary | ICD-10-CM | POA: Diagnosis not present

## 2024-02-01 ENCOUNTER — Ambulatory Visit: Admitting: Dermatology

## 2024-02-01 DIAGNOSIS — R21 Rash and other nonspecific skin eruption: Secondary | ICD-10-CM | POA: Diagnosis not present

## 2024-02-01 DIAGNOSIS — Z79899 Other long term (current) drug therapy: Secondary | ICD-10-CM

## 2024-02-01 DIAGNOSIS — Z7189 Other specified counseling: Secondary | ICD-10-CM

## 2024-02-01 DIAGNOSIS — L209 Atopic dermatitis, unspecified: Secondary | ICD-10-CM | POA: Diagnosis not present

## 2024-02-01 MED ORDER — IVERMECTIN 3 MG PO TABS: ORAL_TABLET | ORAL | 1 refills | Status: AC

## 2024-02-01 MED ORDER — IVERMECTIN 3 MG PO TABS: ORAL_TABLET | ORAL | 1 refills | Status: DC

## 2024-02-01 NOTE — Patient Instructions (Signed)

## 2024-02-01 NOTE — Progress Notes (Signed)
   Follow-Up Visit   Subjective  Trevor Walker is a 58 y.o. male who presents for the following: atopic dermatitis bx proven  Hx of rash at legs, buttocks and arms. Cleared after first dose of ivermectin . Skin was better than it had been in years. Took second dose of ivermectin  and was clear for 1-2 weeks. Started tapering rinvoq  to every other day but then itching restarted. Eventually developed some rash with the itching. Wife is asymptomatic and is a Teacher, early years/pre. Wife will not elect to empiric ivermectin    States still some rash at legs and buttocks but much improved.   The patient has spots, moles and lesions to be evaluated, some may be new or changing and the patient may have concern these could be cancer.   The following portions of the chart were reviewed this encounter and updated as appropriate: medications, allergies, medical history  Review of Systems:  No other skin or systemic complaints except as noted in HPI or Assessment and Plan.  Objective  Well appearing patient in no apparent distress; mood and affect are within normal limits.   A focused examination was performed of the following areas: Legs, arms, buttocks  Relevant exam findings are noted in the Assessment and Plan.    Assessment & Plan   ATOPIC DERMATITIS, severe and recalcitrant to systemic therapy. Flaring despite max dose rinvoq . Cleared for almost 1 month after oral ivermectin  which raises concern for occult scabies, but re-flared when tapering rinvoq  to QOD Exam: Scaly pink papules coalescing to plaques and indurated papulonodules on medial buttocks, popliteal fossae, thighs 5-10% BSA   Patient has tried and failed Dupixent  injection, Enstilar foam, Otezla  30 mg po BID, Calcipotriene  foam, Calcipotriene  solution, and Skyrizi  injections   09/23/20  1. Skin , left forearm proximal SPONGIOTIC VESICULAR DERMATITIS, SEE DESCRIPTION 2. Direct Immunofluorescence, left forearm distal NEGATIVE FOR  IMMUNOREACTANTS 3. Skin , left thigh - posterior SPONGIOTIC VESICULAR DERMATITIS, SEE DESCRIPTION   10/10/23 CBC diff no low counts, CMP normal except high Cr 1.5. no dose adjustment given GFR 54. Normal lipid panel    Chronic and persistent condition with duration or expected duration over one year. Condition is bothersome/symptomatic for patient. Currently flared.     Atopic dermatitis (eczema) is a chronic, relapsing, pruritic condition that can significantly affect quality of life. It is often associated with allergic rhinitis and/or asthma and can require treatment with topical medications, phototherapy, or in severe cases biologic injectable medication (Dupixent ; Adbry) or Oral JAK inhibitors.   Treatment Plan: Patient will get labs done through work within the next two weeks Patient has had shingles vaccine  Continue Rinvoq  30 mg every day  Continue anti-histamine prn Continue Eucrisa  prn  Restart ivermectin  21 mg once then again 14 days later. Discussed that it is unlikely but wife may be an asymptomatic source of repeat scabies infection  Recommend gentle skin care.  RASH AND OTHER NONSPECIFIC SKIN ERUPTION   Related Medications ivermectin  (STROMECTOL ) 3 MG TABS tablet Take 7 tablets once and then a second time 14 days later ATOPIC DERMATITIS, UNSPECIFIED TYPE   LONG-TERM USE OF HIGH-RISK MEDICATION   COUNSELING AND COORDINATION OF CARE   MEDICATION MANAGEMENT    Return for next scheduled.  I, Eleanor Blush, CMA, am acting as scribe for Boneta Sharps, MD.   Documentation: I have reviewed the above documentation for accuracy and completeness, and I agree with the above.  Boneta Sharps, MD

## 2024-02-05 ENCOUNTER — Encounter: Payer: Self-pay | Admitting: Dermatology

## 2024-02-05 DIAGNOSIS — Z79899 Other long term (current) drug therapy: Secondary | ICD-10-CM | POA: Insufficient documentation

## 2024-02-05 DIAGNOSIS — L209 Atopic dermatitis, unspecified: Secondary | ICD-10-CM | POA: Insufficient documentation

## 2024-02-07 DIAGNOSIS — Z0189 Encounter for other specified special examinations: Secondary | ICD-10-CM | POA: Diagnosis not present

## 2024-03-01 DIAGNOSIS — K219 Gastro-esophageal reflux disease without esophagitis: Secondary | ICD-10-CM | POA: Diagnosis not present

## 2024-03-01 DIAGNOSIS — L308 Other specified dermatitis: Secondary | ICD-10-CM | POA: Diagnosis not present

## 2024-03-01 DIAGNOSIS — E785 Hyperlipidemia, unspecified: Secondary | ICD-10-CM | POA: Diagnosis not present

## 2024-03-01 DIAGNOSIS — Z0000 Encounter for general adult medical examination without abnormal findings: Secondary | ICD-10-CM | POA: Diagnosis not present

## 2024-04-01 ENCOUNTER — Other Ambulatory Visit: Payer: Self-pay | Admitting: Dermatology

## 2024-04-24 DIAGNOSIS — D485 Neoplasm of uncertain behavior of skin: Secondary | ICD-10-CM | POA: Diagnosis not present

## 2024-04-24 DIAGNOSIS — H0279 Other degenerative disorders of eyelid and periocular area: Secondary | ICD-10-CM | POA: Diagnosis not present

## 2024-05-02 ENCOUNTER — Ambulatory Visit: Admitting: Dermatology

## 2024-05-02 ENCOUNTER — Encounter: Payer: Self-pay | Admitting: Dermatology

## 2024-05-02 DIAGNOSIS — L209 Atopic dermatitis, unspecified: Secondary | ICD-10-CM | POA: Diagnosis not present

## 2024-05-02 DIAGNOSIS — Z79899 Other long term (current) drug therapy: Secondary | ICD-10-CM | POA: Diagnosis not present

## 2024-05-02 DIAGNOSIS — R42 Dizziness and giddiness: Secondary | ICD-10-CM | POA: Diagnosis not present

## 2024-05-02 DIAGNOSIS — Z7189 Other specified counseling: Secondary | ICD-10-CM

## 2024-05-02 MED ORDER — RINVOQ 30 MG PO TB24: 1.0000 | ORAL_TABLET | Freq: Every day | ORAL | 11 refills | Status: AC

## 2024-05-02 NOTE — Progress Notes (Signed)
 Follow-Up Visit   Subjective  Trevor Walker is a 58 y.o. male who presents for the following: Atopic dermatitis 34m f/u, buttocks, legs, arms, neck Rinvoq  30mg  1 po qd, not using Eucrisa , improved but still has some flares, pt has had some vertigo in past week when he goes from horizontal to vertical   The following portions of the chart were reviewed this encounter and updated as appropriate: medications, allergies, medical history  Review of Systems:  No other skin or systemic complaints except as noted in HPI or Assessment and Plan.  Objective  Well appearing patient in no apparent distress; mood and affect are within normal limits.   A focused examination was performed of the following areas: Neck, scalp, arms, legs, buttocks  Relevant exam findings are noted in the Assessment and Plan.    Assessment & Plan   ATOPIC DERMATITIS, improved with ivermectin  but recurred, severe and recalcitrant, only partially controlled with Rinvoq  Patient has tried and failed Dupixent  injection, Enstilar foam, Otezla  30 mg po BID, Calcipotriene  foam, Calcipotriene  solution, and Skyrizi  injections  Lab from 03/01/24 viewed (in media) Buttocks, arms, legs, neck Exam: Scaly pink papules coalescing to plaques medial buttocks, posterior thighs, popliteal fossae, wrists, occipital scalp 15% BSA  09/23/20  1. Skin , left forearm proximal SPONGIOTIC VESICULAR DERMATITIS, SEE DESCRIPTION 2. Direct Immunofluorescence, left forearm distal NEGATIVE FOR IMMUNOREACTANTS 3. Skin , left thigh - posterior SPONGIOTIC VESICULAR DERMATITIS, SEE DESCRIPTION  Patient has had shingles vaccine   Chronic and persistent condition with duration or expected duration over one year. Condition is symptomatic/ bothersome to patient. Not currently at goal.   Atopic dermatitis (eczema) is a chronic, relapsing, pruritic condition that can significantly affect quality of life. It is often associated with allergic rhinitis  and/or asthma and can require treatment with topical medications, phototherapy, or in severe cases biologic injectable medication (Dupixent ; Adbry) or Oral JAK inhibitors.  Treatment Plan: Cont Rinvoq  30mg  1 po every day, possible side effect of vertigo but suspect inner ear process as below Discussed holding Rinvoq  for 1 week after getting the recombinant flu vaccine. Do not get any live vaccines on rinvoq  Discussed adding Otezla  or Dupixent  in addition to Rinvoq , pt will consider  Recommend gentle skin care.   Discussed potential risks/benefits of JAK inhibitors (Rinvoq /Cibinqo) which are new oral biologic medications used for treating severe, refractory atopic dermatitis.  They are very effective in resolving itchy eczema rashes of the skin.  Overall potential side effects are very minimal and mild and they are much safer than oral steroids. The black box warning for the JAK inhibitor class was discussed, including rare cardiovascular effects, thrombosis, and serious bacterial and viral infections, such as TB or herpes zoster, although the severe CV side effects have not been seen with Rinvoq  or Cibinqo in their safety trials.  Shingles vaccination may be recommended prior to starting medication.  Baseline lab tests are done prior to initiation of medication to ensure pt has no underlying lab abnormalities or infection, and then rechecked at 3 months.  If normal, then periodic doctor visits and annual labs are performed for long term medication management.   VERTIGO   Treatment Plan: Positional per patient (horizontal to vertical). Suspect inner ear process. Recommend discussing with PCP  ATOPIC DERMATITIS, UNSPECIFIED TYPE   VERTIGO   COUNSELING AND COORDINATION OF CARE   MEDICATION MANAGEMENT   LONG-TERM USE OF HIGH-RISK MEDICATION    Return in about 6 months (around 10/30/2024) for Atopic Derm.  I, Grayce Saunas, RMA, am acting as scribe for Boneta Sharps, MD  .   Documentation: I have reviewed the above documentation for accuracy and completeness, and I agree with the above.  Boneta Sharps, MD

## 2024-05-02 NOTE — Patient Instructions (Addendum)
 Labs needed in 1 month CBC with Differential / Platelets CMP  Lipid Panal QuantiFERON-TB Gold   Due to recent changes in healthcare laws, you may see results of your pathology and/or laboratory studies on MyChart before the doctors have had a chance to review them. We understand that in some cases there may be results that are confusing or concerning to you. Please understand that not all results are received at the same time and often the doctors may need to interpret multiple results in order to provide you with the best plan of care or course of treatment. Therefore, we ask that you please give us  2 business days to thoroughly review all your results before contacting the office for clarification. Should we see a critical lab result, you will be contacted sooner.   If You Need Anything After Your Visit  If you have any questions or concerns for your doctor, please call our main line at [phone] and press option 4 to reach your doctor's medical assistant. If no one answers, please leave a voicemail as directed and we will return your call as soon as possible. Messages left after 4 pm will be answered the following business day.   You may also send us  a message via MyChart. We typically respond to MyChart messages within 1-2 business days.  For prescription refills, please ask your pharmacy to contact our office. Our fax number is [phone].  If you have an urgent issue when the clinic is closed that cannot wait until the next business day, you can page your doctor at the number below.    Please note that while we do our best to be available for urgent issues outside of office hours, we are not available 24/7.   If you have an urgent issue and are unable to reach us , you may choose to seek medical care at your doctor's office, retail clinic, urgent care center, or emergency room.  If you have a medical emergency, please immediately call 911 or go to the emergency department.  Pager  Numbers  - Dr. Hester: [phone]  - Dr. Jackquline: [phone]  - Dr. Claudene: [phone]   - Dr. Raymund: [phone]  In the event of inclement weather, please call our main line at [phone] for an update on the status of any delays or closures.  Dermatology Medication Tips: Please keep the boxes that topical medications come in in order to help keep track of the instructions about where and how to use these. Pharmacies typically print the medication instructions only on the boxes and not directly on the medication tubes.   If your medication is too expensive, please contact our office at [phone] option 4 or send us  a message through MyChart.   We are unable to tell what your co-pay for medications will be in advance as this is different depending on your insurance coverage. However, we may be able to find a substitute medication at lower cost or fill out paperwork to get insurance to cover a needed medication.   If a prior authorization is required to get your medication covered by your insurance company, please allow us  1-2 business days to complete this process.  Drug prices often vary depending on where the prescription is filled and some pharmacies may offer cheaper prices.  The website www.goodrx.com contains coupons for medications through different pharmacies. The prices here do not account for what the cost may be with help from insurance (it may be cheaper with your insurance), but the website  can give you the price if you did not use any insurance.  - You can print the associated coupon and take it with your prescription to the pharmacy.  - You may also stop by our office during regular business hours and pick up a GoodRx coupon card.  - If you need your prescription sent electronically to a different pharmacy, notify our office through [facility] or by phone at [phone] option 4.     Si Usted Necesita Algo Despus de Su Visita  Tambin puede  enviarnos un mensaje a travs de Clinical cytogeneticist. Por lo general respondemos a los mensajes de MyChart en el transcurso de 1 a 2 das hbiles.  Para renovar recetas, por favor pida a su farmacia que se ponga en contacto con nuestra oficina. Randi lakes de fax es [place] [phone].  Si tiene un asunto urgente cuando la clnica est cerrada y que no puede esperar hasta el siguiente da hbil, puede llamar/localizar a su doctor(a) al nmero que aparece a continuacin.   Por favor, tenga en cuenta que aunque hacemos todo lo posible para estar disponibles para asuntos urgentes fuera del horario de [place], no estamos disponibles las 24 horas del da, los 7 [address]  [address] de la [place].   Si tiene un problema urgente y no puede comunicarse con nosotros, puede optar por buscar atencin mdica  en el consultorio de su doctor(a), en una clnica privada, en un centro de atencin urgente o en una sala de emergencias.  Si tiene Engineer, drilling, por favor llame inmediatamente al 911 o vaya a la sala de emergencias.  Nmeros de bper  - Dr. Hester: [phone]  - Dra. Jackquline: [phone]  - Dr. Claudene: [phone]  - Dra. Kitts: [phone]  En caso de inclemencias del [place], por favor llame a nuestra lnea principal al [phone] para una actualizacin sobre el estado de cualquier retraso o cierre.  Consejos para la medicacin en dermatologa: Por favor, guarde las cajas en las que vienen los medicamentos de uso tpico para ayudarle a seguir las instrucciones sobre dnde y cmo usarlos. Las farmacias generalmente imprimen las instrucciones del medicamento slo en las cajas y no directamente en los tubos del [place].   Si su medicamento es muy caro, por favor, pngase en contacto con landry rieger llamando al [phone] y presione la opcin 4 o envenos un mensaje a travs de Clinical cytogeneticist.   No podemos decirle cul ser su copago por los medicamentos por adelantado ya que esto es diferente dependiendo  de la cobertura de su seguro. Sin embargo, es posible que podamos encontrar un medicamento sustituto a Audiological scientist un formulario para que el seguro cubra el medicamento que se considera necesario.   Si se requiere una autorizacin previa para que su compaa de seguros malta su medicamento, por favor permtanos de 1 a 2 das hbiles para completar este proceso.  Los precios de los medicamentos varan con frecuencia dependiendo del Environmental consultant de dnde se surte la receta y alguna farmacias pueden ofrecer precios ms baratos.  El sitio web www.goodrx.com tiene cupones para medicamentos de Health and safety inspector. Los precios aqu no tienen en cuenta lo que podra costar con la ayuda del seguro (puede ser ms barato con su seguro), pero el sitio web puede darle el precio si no utiliz Tourist information centre manager.  - Puede imprimir el cupn correspondiente y llevarlo con su receta a la farmacia.  - Tambin puede pasar por nuestra oficina durante el horario de atencin regular y Librarian, academic tarjeta  de cupones de GoodRx.  - Si necesita que su receta se enve electrnicamente a una farmacia diferente, informe a nuestra oficina a travs de MyChart de [place] o por telfono llamando al [phone] y presione la opcin 4.

## 2024-05-20 DIAGNOSIS — D485 Neoplasm of uncertain behavior of skin: Secondary | ICD-10-CM | POA: Diagnosis not present

## 2024-10-31 ENCOUNTER — Ambulatory Visit: Admitting: Dermatology
